# Patient Record
Sex: Male | Born: 1950
Health system: Southern US, Community
[De-identification: ages and names within clinical notes are randomized; demographics above are authoritative.]

## PROBLEM LIST (undated history)

## (undated) DIAGNOSIS — W3400XA Accidental discharge from unspecified firearms or gun, initial encounter: Secondary | ICD-10-CM

## (undated) DIAGNOSIS — I10 Essential (primary) hypertension: Secondary | ICD-10-CM

## (undated) DIAGNOSIS — B1920 Unspecified viral hepatitis C without hepatic coma: Secondary | ICD-10-CM

## (undated) DIAGNOSIS — F209 Schizophrenia, unspecified: Secondary | ICD-10-CM

## (undated) DIAGNOSIS — K651 Peritoneal abscess: Secondary | ICD-10-CM

## (undated) HISTORY — PX: OTHER SURGICAL HISTORY: SHX169

---

## 2000-07-01 ENCOUNTER — Emergency Department (HOSPITAL_COMMUNITY): Admission: EM | Admit: 2000-07-01 | Discharge: 2000-07-01 | Payer: Self-pay | Admitting: *Deleted

## 2011-10-02 ENCOUNTER — Emergency Department (HOSPITAL_COMMUNITY)
Admission: EM | Admit: 2011-10-02 | Discharge: 2011-10-02 | Disposition: A | Discharge status: Home / Self Care | Payer: Self-pay | Attending: Emergency Medicine | Admitting: Emergency Medicine

## 2011-10-02 ENCOUNTER — Emergency Department (HOSPITAL_COMMUNITY): Payer: Self-pay

## 2011-10-02 ENCOUNTER — Encounter (HOSPITAL_COMMUNITY): Payer: Self-pay | Admitting: Emergency Medicine

## 2011-10-02 DIAGNOSIS — M79606 Pain in leg, unspecified: Secondary | ICD-10-CM

## 2011-10-02 DIAGNOSIS — M79609 Pain in unspecified limb: Secondary | ICD-10-CM | POA: Insufficient documentation

## 2011-10-02 DIAGNOSIS — I10 Essential (primary) hypertension: Secondary | ICD-10-CM | POA: Insufficient documentation

## 2011-10-02 DIAGNOSIS — Z79899 Other long term (current) drug therapy: Secondary | ICD-10-CM | POA: Insufficient documentation

## 2011-10-02 DIAGNOSIS — Z87828 Personal history of other (healed) physical injury and trauma: Secondary | ICD-10-CM | POA: Insufficient documentation

## 2011-10-02 HISTORY — DX: Essential (primary) hypertension: I10

## 2011-10-02 HISTORY — DX: Accidental discharge from unspecified firearms or gun, initial encounter: W34.00XA

## 2011-10-02 MED ORDER — OXYCODONE-ACETAMINOPHEN 5-325 MG PO TABS
1.0000 | ORAL_TABLET | Freq: Once | ORAL | Status: AC
  Administered 2011-10-02: 1 via ORAL
  Filled 2011-10-02: qty 1

## 2011-10-02 MED ORDER — HYDROCODONE-ACETAMINOPHEN 5-325 MG PO TABS: 1.0000 | ORAL_TABLET | Freq: Four times a day (QID) | ORAL | Status: AC | PRN

## 2011-10-02 MED ORDER — NAPROXEN 500 MG PO TABS: 500.0000 mg | ORAL_TABLET | Freq: Two times a day (BID) | ORAL | Status: DC

## 2011-10-02 NOTE — ED Notes (Signed)
Patient c/o left leg pain "from the hip down to the ankle" for five days worsening each day. Patient denies history of back injury or leg injury.
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
## 2012-01-13 ENCOUNTER — Encounter (HOSPITAL_COMMUNITY): Payer: Self-pay | Admitting: Emergency Medicine

## 2012-01-13 DIAGNOSIS — I10 Essential (primary) hypertension: Secondary | ICD-10-CM | POA: Insufficient documentation

## 2012-01-13 DIAGNOSIS — Z87828 Personal history of other (healed) physical injury and trauma: Secondary | ICD-10-CM | POA: Insufficient documentation

## 2012-01-13 DIAGNOSIS — R109 Unspecified abdominal pain: Secondary | ICD-10-CM | POA: Insufficient documentation

## 2012-01-13 DIAGNOSIS — Z79899 Other long term (current) drug therapy: Secondary | ICD-10-CM | POA: Insufficient documentation

## 2012-01-13 DIAGNOSIS — R112 Nausea with vomiting, unspecified: Secondary | ICD-10-CM | POA: Insufficient documentation

## 2012-01-13 DIAGNOSIS — R197 Diarrhea, unspecified: Secondary | ICD-10-CM | POA: Insufficient documentation

## 2012-01-13 DIAGNOSIS — R509 Fever, unspecified: Secondary | ICD-10-CM | POA: Insufficient documentation

## 2012-01-13 NOTE — ED Notes (Signed)
Patient complaining of fever, abdominal pain, vomiting, and diarrhea x 3 days.

## 2012-01-14 ENCOUNTER — Emergency Department (HOSPITAL_COMMUNITY)
Admission: EM | Admit: 2012-01-14 | Discharge: 2012-01-14 | Disposition: A | Discharge status: Home / Self Care | Payer: Self-pay | Attending: Emergency Medicine | Admitting: Emergency Medicine

## 2012-01-14 DIAGNOSIS — R112 Nausea with vomiting, unspecified: Secondary | ICD-10-CM

## 2012-01-14 DIAGNOSIS — R509 Fever, unspecified: Secondary | ICD-10-CM

## 2012-01-14 DIAGNOSIS — R109 Unspecified abdominal pain: Secondary | ICD-10-CM

## 2012-01-14 LAB — BASIC METABOLIC PANEL
Chloride: 98 mEq/L (ref 96–112)
GFR calc Af Amer: 63 mL/min — ABNORMAL LOW (ref >90)
Potassium: 3.6 mEq/L (ref 3.5–5.1)
Sodium: 131 mEq/L — ABNORMAL LOW (ref 135–145)

## 2012-01-14 LAB — CBC
HCT: 43.4 % (ref 39.0–52.0)
Hemoglobin: 15.2 g/dL (ref 13.0–17.0)
RBC: 5.08 MIL/uL (ref 4.22–5.81)
WBC: 25.3 10*3/uL — ABNORMAL HIGH (ref 4.0–10.5)

## 2012-01-14 MED ORDER — ONDANSETRON HCL 4 MG/2ML IJ SOLN: 4.0000 mg | Freq: Once | INTRAMUSCULAR | Status: DC

## 2012-01-14 MED ORDER — HYDROCODONE-ACETAMINOPHEN 5-325 MG PO TABS: 1.0000 | ORAL_TABLET | ORAL | Status: DC | PRN

## 2012-01-14 MED ORDER — ONDANSETRON HCL 4 MG/2ML IJ SOLN
4.0000 mg | Freq: Once | INTRAMUSCULAR | Status: AC
Start: 2012-01-14 — End: 2012-01-14
  Administered 2012-01-14: 4 mg via INTRAVENOUS
  Filled 2012-01-14: qty 2

## 2012-01-14 MED ORDER — MORPHINE SULFATE 2 MG/ML IJ SOLN
2.0000 mg | Freq: Once | INTRAMUSCULAR | Status: AC
  Administered 2012-01-14: 2 mg via INTRAVENOUS
  Filled 2012-01-14: qty 1

## 2012-01-14 MED ORDER — ONDANSETRON HCL 4 MG PO TABS: 4.0000 mg | ORAL_TABLET | Freq: Four times a day (QID) | ORAL | Status: DC

## 2012-01-14 MED ORDER — SODIUM CHLORIDE 0.9 % IV BOLUS (SEPSIS)
1000.0000 mL | Freq: Once | INTRAVENOUS | Status: AC
  Administered 2012-01-14: 1000 mL via INTRAVENOUS

## 2012-01-14 NOTE — ED Notes (Signed)
N/v/d on and off x3 days, States that he has not been able to hold down any food and not much fluid. States 2 episodes of emesis tonight PTA. Pt aax4 at this time. Also complains of mid right sided abd pain, tender to touch
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
## 2012-01-15 DIAGNOSIS — K651 Peritoneal abscess: Secondary | ICD-10-CM

## 2012-01-15 HISTORY — DX: Peritoneal abscess: K65.1

## 2012-01-24 ENCOUNTER — Inpatient Hospital Stay (HOSPITAL_COMMUNITY)
Admission: EM | Admit: 2012-01-24 | Discharge: 2012-01-27 | DRG: 373 | Disposition: A | Discharge status: Home / Self Care | Payer: Medicare Other | Attending: General Surgery | Admitting: General Surgery

## 2012-01-24 ENCOUNTER — Emergency Department (HOSPITAL_COMMUNITY): Payer: Medicare Other

## 2012-01-24 ENCOUNTER — Encounter (HOSPITAL_COMMUNITY): Payer: Self-pay

## 2012-01-24 DIAGNOSIS — Z7982 Long term (current) use of aspirin: Secondary | ICD-10-CM

## 2012-01-24 DIAGNOSIS — K651 Peritoneal abscess: Secondary | ICD-10-CM

## 2012-01-24 DIAGNOSIS — I10 Essential (primary) hypertension: Secondary | ICD-10-CM | POA: Diagnosis present

## 2012-01-24 DIAGNOSIS — Z79899 Other long term (current) drug therapy: Secondary | ICD-10-CM

## 2012-01-24 LAB — CBC WITH DIFFERENTIAL/PLATELET
Basophils Relative: 1 % (ref 0–1)
Eosinophils Absolute: 0.2 10*3/uL (ref 0.0–0.7)
Eosinophils Relative: 2 % (ref 0–5)
Hemoglobin: 11.9 g/dL — ABNORMAL LOW (ref 13.0–17.0)
MCH: 29.2 pg (ref 26.0–34.0)
MCHC: 34.4 g/dL (ref 30.0–36.0)
MCV: 84.8 fL (ref 78.0–100.0)
Monocytes Relative: 9 % (ref 3–12)
Neutrophils Relative %: 67 % (ref 43–77)

## 2012-01-24 LAB — URINALYSIS, ROUTINE W REFLEX MICROSCOPIC
Bilirubin Urine: NEGATIVE
Hgb urine dipstick: NEGATIVE
Ketones, ur: NEGATIVE mg/dL
Nitrite: NEGATIVE
Urobilinogen, UA: 2 mg/dL — ABNORMAL HIGH (ref 0.0–1.0)
pH: 6 (ref 5.0–8.0)

## 2012-01-24 LAB — COMPREHENSIVE METABOLIC PANEL
Alkaline Phosphatase: 61 U/L (ref 39–117)
BUN: 10 mg/dL (ref 6–23)
Chloride: 101 mEq/L (ref 96–112)
GFR calc Af Amer: >90 mL/min (ref >90)
Glucose, Bld: 101 mg/dL — ABNORMAL HIGH (ref 70–99)
Potassium: 3.7 mEq/L (ref 3.5–5.1)
Total Bilirubin: 0.5 mg/dL (ref 0.3–1.2)

## 2012-01-24 LAB — LIPASE, BLOOD: Lipase: 39 U/L (ref 11–59)

## 2012-01-24 MED ORDER — HYDROMORPHONE HCL PF 1 MG/ML IJ SOLN
1.0000 mg | INTRAMUSCULAR | Status: DC | PRN
  Administered 2012-01-24 – 2012-01-27 (×7): 1 mg via INTRAVENOUS
  Filled 2012-01-24 (×7): qty 1

## 2012-01-24 MED ORDER — ONDANSETRON HCL 4 MG/2ML IJ SOLN: 4.0000 mg | Freq: Four times a day (QID) | INTRAMUSCULAR | Status: DC | PRN

## 2012-01-24 MED ORDER — SODIUM CHLORIDE 0.9 % IV SOLN
1000.0000 mL | INTRAVENOUS | Status: DC
  Administered 2012-01-24 – 2012-01-26 (×2): 1000 mL via INTRAVENOUS

## 2012-01-24 MED ORDER — LACTATED RINGERS IV SOLN
INTRAVENOUS | Status: DC
  Administered 2012-01-24 – 2012-01-25 (×3): via INTRAVENOUS

## 2012-01-24 MED ORDER — ENOXAPARIN SODIUM 40 MG/0.4ML ~~LOC~~ SOLN
40.0000 mg | SUBCUTANEOUS | Status: DC
  Administered 2012-01-25 – 2012-01-27 (×4): 40 mg via SUBCUTANEOUS
  Filled 2012-01-24 (×5): qty 0.4

## 2012-01-24 MED ORDER — IOHEXOL 300 MG/ML  SOLN
100.0000 mL | Freq: Once | INTRAMUSCULAR | Status: AC | PRN
  Administered 2012-01-24: 100 mL via INTRAVENOUS

## 2012-01-24 MED ORDER — SODIUM CHLORIDE 0.9 % IV SOLN
Freq: Once | INTRAVENOUS | Status: AC
Start: 2012-01-24 — End: 2012-01-24
  Administered 2012-01-24: 20:00:00 via INTRAVENOUS

## 2012-01-24 MED ORDER — PANTOPRAZOLE SODIUM 40 MG IV SOLR
40.0000 mg | Freq: Every day | INTRAVENOUS | Status: DC
  Administered 2012-01-24 – 2012-01-26 (×3): 40 mg via INTRAVENOUS
  Filled 2012-01-24 (×3): qty 40
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
## 2012-01-24 NOTE — ED Notes (Signed)
Pt reports severe abd pain for the past few days.  Pt denies any n/v/d.

## 2012-01-25 ENCOUNTER — Encounter (HOSPITAL_COMMUNITY): Payer: Self-pay | Admitting: *Deleted

## 2012-01-25 LAB — BASIC METABOLIC PANEL
BUN: 10 mg/dL (ref 6–23)
CO2: 22 mEq/L (ref 19–32)
Calcium: 8.4 mg/dL (ref 8.4–10.5)
GFR calc non Af Amer: 88 mL/min — ABNORMAL LOW (ref >90)
Glucose, Bld: 100 mg/dL — ABNORMAL HIGH (ref 70–99)
Potassium: 3.6 mEq/L (ref 3.5–5.1)

## 2012-01-25 LAB — CBC
Hemoglobin: 11 g/dL — ABNORMAL LOW (ref 13.0–17.0)
MCH: 29.1 pg (ref 26.0–34.0)
MCHC: 34.2 g/dL (ref 30.0–36.0)
MCV: 85.2 fL (ref 78.0–100.0)
RBC: 3.78 MIL/uL — ABNORMAL LOW (ref 4.22–5.81)

## 2012-01-25 MED ORDER — SODIUM CHLORIDE 0.9 % IV SOLN
1.0000 g | INTRAVENOUS | Status: DC
  Administered 2012-01-25 – 2012-01-27 (×3): 1 g via INTRAVENOUS
  Filled 2012-01-25 (×4): qty 1

## 2012-01-25 MED ORDER — BENZTROPINE MESYLATE 1 MG PO TABS
4.0000 mg | ORAL_TABLET | Freq: Every day | ORAL | Status: DC
  Administered 2012-01-25 – 2012-01-26 (×2): 4 mg via ORAL
  Filled 2012-01-25 (×2): qty 4

## 2012-01-25 MED ORDER — THIOTHIXENE 5 MG PO CAPS
15.0000 mg | ORAL_CAPSULE | Freq: Every day | ORAL | Status: DC
  Administered 2012-01-25 – 2012-01-26 (×2): 15 mg via ORAL
  Filled 2012-01-25 (×2): qty 3
  Filled 2012-01-25 (×2): qty 1

## 2012-01-25 NOTE — Progress Notes (Signed)
Pt. abmulated in hall, with no assistance. Pt. Tolerated well a distance of 661ft. No SOB, no significant increase in pain.
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
## 2012-01-26 ENCOUNTER — Encounter (HOSPITAL_COMMUNITY): Payer: Self-pay

## 2012-01-26 LAB — CBC
HCT: 32.7 % — ABNORMAL LOW (ref 39.0–52.0)
MCHC: 34.6 g/dL (ref 30.0–36.0)
MCV: 84.5 fL (ref 78.0–100.0)
Platelets: 673 10*3/uL — ABNORMAL HIGH (ref 150–400)
RDW: 13.2 % (ref 11.5–15.5)
WBC: 9.4 10*3/uL (ref 4.0–10.5)

## 2012-01-26 LAB — BASIC METABOLIC PANEL
BUN: 11 mg/dL (ref 6–23)
Calcium: 8.8 mg/dL (ref 8.4–10.5)
Chloride: 99 mEq/L (ref 96–112)
Creatinine, Ser: 1.02 mg/dL (ref 0.50–1.35)
GFR calc Af Amer: 89 mL/min — ABNORMAL LOW (ref >90)
GFR calc non Af Amer: 77 mL/min — ABNORMAL LOW (ref >90)

## 2012-01-26 MED ORDER — SODIUM CHLORIDE 0.9 % IJ SOLN: 3.0000 mL | INTRAMUSCULAR | Status: DC | PRN

## 2012-01-26 MED ORDER — SODIUM CHLORIDE 0.9 % IV SOLN: 250.0000 mL | INTRAVENOUS | Status: DC | PRN

## 2012-01-26 MED ORDER — SODIUM CHLORIDE 0.9 % IJ SOLN
3.0000 mL | Freq: Two times a day (BID) | INTRAMUSCULAR | Status: DC
  Administered 2012-01-26 – 2012-01-27 (×3): 3 mL via INTRAVENOUS

## 2012-01-26 NOTE — Progress Notes (Signed)
  Subjective: Pain much better.  No fever or chills.  No nausea.    Objective: Vital signs in last 24 hours: Temp:  [97.5 F (36.4 C)-99.3 F (37.4 C)] 99.3 F (37.4 C) (01/12 2043) Pulse Rate:  [62-80] 73  (01/12 2043) Resp:  [18] 18  (01/12 2043) BP: (111-118)/(70-77) 114/77 mmHg (01/12 2043) SpO2:  [97 %-100 %] 97 % (01/12 2043) Last BM Date: 01/26/12  Intake/Output from previous day:   Intake/Output this shift:    General appearance: alert and no distress GI: +BS, soft, mild tenderness.  NO peritoneal signs.  No masses.    Lab Results:   [facility] 01/26/12 0645 01/25/12 0556  WBC 9.4 11.9*  HGB 11.3* 11.0*  HCT 32.7* 32.2*  PLT 673* 664*   BMET  Basename 01/26/12 0645 01/25/12 0556  NA 131* 132*  K 3.7 3.6  CL 99 100  CO2 24 22  GLUCOSE 108* 100*  BUN 11 10  CREATININE 1.02 0.93  CALCIUM 8.8 8.4   PT/INR No results found for this basename: LABPROT:2,INR:2 in the last 72 hours ABG No results found for this basename: PHART:2,PCO2:2,PO2:2,HCO3:2 in the last 72 hours  Studies/Results: No results found.  Anti-infectives: Anti-infectives     Start     Dose/Rate Route Frequency Ordered Stop   01/25/12 0030   ertapenem (INVANZ) 1 g in sodium chloride 0.9 % 50 mL IVPB        1 g 100 mL/hr over 30 Minutes Intravenous Every 24 hours 01/25/12 0025            Assessment/Plan: s/p * No surgery found * Intra-abdominal abscess.  Right sided.  Responding appropriately to abx and bowel rest.Will start clears.  Possible transition to oral abx medications tomorrow.    LOS: 2 days    Alik Mawson C 01/26/2012

## 2012-01-27 LAB — BASIC METABOLIC PANEL
BUN: 10 mg/dL (ref 6–23)
CO2: 26 mEq/L (ref 19–32)
Calcium: 9.3 mg/dL (ref 8.4–10.5)
Chloride: 100 mEq/L (ref 96–112)
Creatinine, Ser: 1.1 mg/dL (ref 0.50–1.35)
Glucose, Bld: 98 mg/dL (ref 70–99)

## 2012-01-27 LAB — CBC
HCT: 33.7 % — ABNORMAL LOW (ref 39.0–52.0)
Hemoglobin: 11.5 g/dL — ABNORMAL LOW (ref 13.0–17.0)
MCH: 28.9 pg (ref 26.0–34.0)
MCV: 84.7 fL (ref 78.0–100.0)
Platelets: 663 10*3/uL — ABNORMAL HIGH (ref 150–400)
RBC: 3.98 MIL/uL — ABNORMAL LOW (ref 4.22–5.81)
WBC: 8.1 10*3/uL (ref 4.0–10.5)

## 2012-01-27 MED ORDER — AMOXICILLIN-POT CLAVULANATE 875-125 MG PO TABS
1.0000 | ORAL_TABLET | Freq: Two times a day (BID) | ORAL | Status: DC
  Administered 2012-01-27: 1 via ORAL
  Filled 2012-01-27: qty 1

## 2012-01-27 MED ORDER — AMOXICILLIN-POT CLAVULANATE 875-125 MG PO TABS: 1.0000 | ORAL_TABLET | Freq: Two times a day (BID) | ORAL | Status: DC

## 2012-01-27 MED ORDER — HYDROCODONE-ACETAMINOPHEN 5-325 MG PO TABS: 1.0000 | ORAL_TABLET | ORAL | Status: DC | PRN

## 2012-01-27 NOTE — Care Management Note (Signed)
    Page 1 of 1   01/27/2012     2:35:38 PM   CARE MANAGEMENT NOTE 01/27/2012  Patient:  Riley Dixon, Riley Dixon   Account Number:  [id number]  Date Initiated:  01/27/2012  Documentation initiated by:  Sharrie Rothman  Subjective/Objective Assessment:   Pt admitted from home with abd pain. Pt lives alone and will return home at discharge. Pt is independent with ADL's.     Action/Plan:   Pt is discharged home today. No CM or HH needs noted.   Anticipated DC Date:  01/27/2012   Anticipated DC Plan:  HOME/SELF CARE      DC Planning Services  CM consult      Choice offered to / List presented to:             Status of service:  Completed, signed off Medicare Important Message given?  YES (If response is "NO", the following Medicare IM given date fields will be blank) Date Medicare IM given:  01/27/2012 Date Additional Medicare IM given:    Discharge Disposition:  HOME/SELF CARE  Per UR Regulation:    If discussed at Long Length of Stay Meetings, dates discussed:    Comments:  01/27/12 1435 Arlyss Queen, RN BSN CM

## 2012-01-27 NOTE — Progress Notes (Signed)
  Subjective: Pain is resolved. No nausea. Tolerating regular diet. No fevers or chills.  Objective: Vital signs in last 24 hours: Temp:  [98 F (36.7 C)-99.3 F (37.4 C)] 98.7 F (37.1 C) (01/13 0506) Pulse Rate:  [67-80] 67  (01/13 0506) Resp:  [18] 18  (01/13 0506) BP: (97-118)/(64-77) 97/64 mmHg (01/13 0506) SpO2:  [97 %-100 %] 100 % (01/13 0506) Last BM Date: 01/26/12  Intake/Output from previous day: 01/12 0701 - 01/13 0700 In: 876 [P.O.:720; I.V.:6; IV Piggyback:150] Out: -  Intake/Output this shift: Total I/O In: 240 [P.O.:240] Out: -   General appearance: alert and no distress GI: Abdomen soft, positive bowel sounds, no tenderness. No masses.  Lab Results:   [facility] 01/27/12 0605 01/26/12 0645  WBC 8.1 9.4  HGB 11.5* 11.3*  HCT 33.7* 32.7*  PLT 663* 673*   BMET  Basename 01/27/12 0605 01/26/12 0645  NA 134* 131*  K 4.2 3.7  CL 100 99  CO2 26 24  GLUCOSE 98 108*  BUN 10 11  CREATININE 1.10 1.02  CALCIUM 9.3 8.8   PT/INR No results found for this basename: LABPROT:2,INR:2 in the last 72 hours ABG No results found for this basename: PHART:2,PCO2:2,PO2:2,HCO3:2 in the last 72 hours  Studies/Results: No results found.  Anti-infectives: Anti-infectives     Start     Dose/Rate Route Frequency Ordered Stop   01/27/12 1215   amoxicillin-clavulanate (AUGMENTIN) 875-125 MG per tablet 1 tablet        1 tablet Oral Every 12 hours 01/27/12 1202     01/25/12 0030   ertapenem (INVANZ) 1 g in sodium chloride 0.9 % 50 mL IVPB  Status:  Discontinued        1 g 100 mL/hr over 30 Minutes Intravenous Every 24 hours 01/25/12 0025 01/27/12 1202          Assessment/Plan: s/p * No surgery found * Intra-abdominal abscess. Continue AmBisome. We'll transition patient to oral antibiotics. He tolerates these I am comfortable with patient being discharged with close followup next week in the office. I've also discussed with the patient repeat of the CT scan of the  abdomen and pelvis to reevaluate progression with the intradermal fluid collection. Patient expresses understanding. Patient understands reasons to return to the hospital or to notify her options. Again should patient tolerate oral antibiotics will plan to discharge later this afternoon.  LOS: 3 days    Kashif Pooler C 01/27/2012

## 2012-01-27 NOTE — Progress Notes (Signed)
UR chart review completed.  

## 2012-01-27 NOTE — Progress Notes (Signed)
Discharge instructions given, verbalized understanding, out in stable condition ambulatory with family. 

## 2012-02-19 NOTE — Discharge Summary (Signed)
Physician Discharge Summary  Patient ID: Riley Dixon MRN: [id number] DOB/AGE: [birth year] 63 y.o.  Admit date: 01/24/2012 Discharge date: 01/27/2012  Admission Diagnoses:Intra-abdominal abscess  Discharge Diagnoses: The same Active Problems:  * No active hospital problems. *    Discharged Condition: stable  Hospital Course: Patient presented with Right sided abdominal pain.  A localized phlegmon with suspected associated abscess was noted.  Patient admitted for continue management.  IV abx started.  Patient tolerated conservative measures.  Started on oral abx and plans were made for discharge.    Consults: None  Significant Diagnostic Studies: radiology: CT scan: abd/pel  Treatments: IV hydration and antibiotics: Invanz  Discharge Exam: Blood pressure 121/75, pulse 73, temperature 98 F (36.7 C), temperature source Oral, resp. rate 18, height 5\' 8"  (1.727 m), weight 85.9 kg (189 lb 6 oz), SpO2 100.00%. General appearance: alert and no distress Resp: clear to auscultation bilaterally Cardio: regular rate and rhythm GI: soft, +BS, mild tenderness.  No peritoneal signs.  Disposition: 01-Home or Self Care  Discharge Orders    Future Orders Please Complete By Expires   Diet - low sodium heart healthy      Increase activity slowly      Discharge instructions      Comments:   Increase activity as tolerated. May place ice pack for comfort.  Alternate an anti-inflammatory such as ibuprofen (Motrin, Advil) 400-600mg  every 6 hours with the prescribed pain medication.   Do not take any additional acetaminophen as there is Tylenol in the pain medication.   Driving Restrictions      Comments:   No driving while on pain medications.   Call MD for:  temperature >100.4      Call MD for:  persistant nausea and vomiting      Call MD for:  severe uncontrolled pain      Call MD for:  redness, tenderness, or signs of infection (pain, swelling, redness, odor or green/yellow discharge around  incision site)          Medication List     As of 02/19/2012 10:12 PM    TAKE these medications         acetaminophen 500 MG tablet   Commonly known as: TYLENOL   Take 1,000 mg by mouth every 6 (six) hours as needed. Pain      amoxicillin-clavulanate 875-125 MG per tablet   Commonly known as: AUGMENTIN   Take 1 tablet by mouth every 12 (twelve) hours.      aspirin EC 81 MG tablet   Take 81 mg by mouth daily.      benztropine 2 MG tablet   Commonly known as: COGENTIN   Take 4 mg by mouth at bedtime.      HYDROcodone-acetaminophen 5-325 MG per tablet   Commonly known as: NORCO/VICODIN   Take 1-2 tablets by mouth every 4 (four) hours as needed for pain.      thiothixene 5 MG capsule   Commonly known as: NAVANE   Take 15 mg by mouth at bedtime.           Follow-up Information    Follow up with Fabio Bering, MD. On 02/04/2012. (9:30 am)    Contact information:   [address] [place] [address] [phone]          Signed: Tilford Pillar C 02/19/2012, 10:12 PM

## 2013-04-27 ENCOUNTER — Encounter (HOSPITAL_COMMUNITY): Payer: Self-pay | Admitting: Emergency Medicine

## 2013-04-27 ENCOUNTER — Emergency Department (HOSPITAL_COMMUNITY)
Admission: EM | Admit: 2013-04-27 | Discharge: 2013-04-27 | Disposition: A | Discharge status: Home / Self Care | Payer: No Typology Code available for payment source | Attending: Emergency Medicine | Admitting: Emergency Medicine

## 2013-04-27 ENCOUNTER — Emergency Department (HOSPITAL_COMMUNITY): Payer: No Typology Code available for payment source

## 2013-04-27 DIAGNOSIS — Z7982 Long term (current) use of aspirin: Secondary | ICD-10-CM | POA: Insufficient documentation

## 2013-04-27 DIAGNOSIS — Z8639 Personal history of other endocrine, nutritional and metabolic disease: Secondary | ICD-10-CM | POA: Insufficient documentation

## 2013-04-27 DIAGNOSIS — S7000XA Contusion of unspecified hip, initial encounter: Secondary | ICD-10-CM | POA: Insufficient documentation

## 2013-04-27 DIAGNOSIS — F209 Schizophrenia, unspecified: Secondary | ICD-10-CM | POA: Insufficient documentation

## 2013-04-27 DIAGNOSIS — Z8619 Personal history of other infectious and parasitic diseases: Secondary | ICD-10-CM | POA: Insufficient documentation

## 2013-04-27 DIAGNOSIS — Z862 Personal history of diseases of the blood and blood-forming organs and certain disorders involving the immune mechanism: Secondary | ICD-10-CM | POA: Insufficient documentation

## 2013-04-27 DIAGNOSIS — Y9389 Activity, other specified: Secondary | ICD-10-CM | POA: Insufficient documentation

## 2013-04-27 DIAGNOSIS — Z87828 Personal history of other (healed) physical injury and trauma: Secondary | ICD-10-CM | POA: Insufficient documentation

## 2013-04-27 DIAGNOSIS — I10 Essential (primary) hypertension: Secondary | ICD-10-CM | POA: Insufficient documentation

## 2013-04-27 DIAGNOSIS — Z79899 Other long term (current) drug therapy: Secondary | ICD-10-CM | POA: Insufficient documentation

## 2013-04-27 DIAGNOSIS — Y92410 Unspecified street and highway as the place of occurrence of the external cause: Secondary | ICD-10-CM | POA: Insufficient documentation

## 2013-04-27 HISTORY — DX: Unspecified viral hepatitis C without hepatic coma: B19.20

## 2013-04-27 HISTORY — DX: Schizophrenia, unspecified: F20.9

## 2013-04-27 MED ORDER — HYDROCODONE-ACETAMINOPHEN 5-325 MG PO TABS
2.0000 | ORAL_TABLET | Freq: Once | ORAL | Status: AC
  Administered 2013-04-27: 2 via ORAL
  Filled 2013-04-27: qty 2

## 2013-04-27 MED ORDER — HYDROCODONE-ACETAMINOPHEN 5-325 MG PO TABS: 1.0000 | ORAL_TABLET | Freq: Four times a day (QID) | ORAL | Status: DC | PRN

## 2013-04-27 NOTE — Discharge Instructions (Signed)
Always wear a helmet when riding a bicycle. Take Tylenol as directed for mild pain or the pain medicine prescribed for bad pain. See the [facility] or go to an urgent care Center if significant pain in 3 or 4 days.  Contusion A contusion is a deep bruise. Contusions are the result of an injury that caused bleeding under the skin. The contusion may turn blue, purple, or yellow. Minor injuries will give you a painless contusion, but more severe contusions may stay painful and swollen for a few weeks.  CAUSES  A contusion is usually caused by a blow, trauma, or direct force to an area of the body. SYMPTOMS   Swelling and redness of the injured area.  Bruising of the injured area.  Tenderness and soreness of the injured area.  Pain. DIAGNOSIS  The diagnosis can be made by taking a history and physical exam. An X-ray, CT scan, or MRI may be needed to determine if there were any associated injuries, such as fractures. TREATMENT  Specific treatment will depend on what area of the body was injured. In general, the best treatment for a contusion is resting, icing, elevating, and applying cold compresses to the injured area. Over-the-counter medicines may also be recommended for pain control. Ask your caregiver what the best treatment is for your contusion. HOME CARE INSTRUCTIONS   Put ice on the injured area.  Put ice in a plastic bag.  Place a towel between your skin and the bag.  Leave the ice on for 15-20 minutes, 03-04 times a day.  Only take over-the-counter or prescription medicines for pain, discomfort, or fever as directed by your caregiver. Your caregiver may recommend avoiding anti-inflammatory medicines (aspirin, ibuprofen, and naproxen) for 48 hours because these medicines may increase bruising.  Rest the injured area.  If possible, elevate the injured area to reduce swelling. SEEK IMMEDIATE MEDICAL CARE IF:   You have increased bruising or swelling.  You have pain that is  getting worse.  Your swelling or pain is not relieved with medicines. MAKE SURE YOU:   Understand these instructions.  Will watch your condition.  Will get help right away if you are not doing well or get worse. Document Released: 10/10/2004 Document Revised: 03/25/2011 Document Reviewed: 11/05/2010 [facility] Patient Information 2014 [place], [place].
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
## 2014-01-14 HISTORY — PX: REPLACEMENT TOTAL KNEE: SUR1224

## 2015-03-07 DIAGNOSIS — F251 Schizoaffective disorder, depressive type: Secondary | ICD-10-CM | POA: Diagnosis not present

## 2015-03-15 DIAGNOSIS — I10 Essential (primary) hypertension: Secondary | ICD-10-CM | POA: Diagnosis not present

## 2015-03-15 DIAGNOSIS — J22 Unspecified acute lower respiratory infection: Secondary | ICD-10-CM | POA: Diagnosis not present

## 2015-06-27 DIAGNOSIS — F251 Schizoaffective disorder, depressive type: Secondary | ICD-10-CM | POA: Diagnosis not present

## 2015-10-17 DIAGNOSIS — F251 Schizoaffective disorder, depressive type: Secondary | ICD-10-CM | POA: Diagnosis not present

## 2015-12-09 ENCOUNTER — Emergency Department (HOSPITAL_COMMUNITY): Payer: PPO

## 2015-12-09 ENCOUNTER — Observation Stay (HOSPITAL_COMMUNITY)
Admission: EM | Admit: 2015-12-09 | Discharge: 2015-12-10 | Disposition: A | Discharge status: Home / Self Care | Payer: PPO | Attending: Internal Medicine | Admitting: Internal Medicine

## 2015-12-09 ENCOUNTER — Encounter (HOSPITAL_COMMUNITY): Payer: Self-pay | Admitting: Emergency Medicine

## 2015-12-09 DIAGNOSIS — C50929 Malignant neoplasm of unspecified site of unspecified male breast: Secondary | ICD-10-CM | POA: Diagnosis present

## 2015-12-09 DIAGNOSIS — I10 Essential (primary) hypertension: Secondary | ICD-10-CM | POA: Insufficient documentation

## 2015-12-09 DIAGNOSIS — F209 Schizophrenia, unspecified: Secondary | ICD-10-CM | POA: Diagnosis present

## 2015-12-09 DIAGNOSIS — Z7982 Long term (current) use of aspirin: Secondary | ICD-10-CM | POA: Diagnosis not present

## 2015-12-09 DIAGNOSIS — N281 Cyst of kidney, acquired: Secondary | ICD-10-CM | POA: Diagnosis not present

## 2015-12-09 DIAGNOSIS — R197 Diarrhea, unspecified: Secondary | ICD-10-CM

## 2015-12-09 DIAGNOSIS — K529 Noninfective gastroenteritis and colitis, unspecified: Secondary | ICD-10-CM | POA: Diagnosis not present

## 2015-12-09 DIAGNOSIS — Z23 Encounter for immunization: Secondary | ICD-10-CM | POA: Insufficient documentation

## 2015-12-09 DIAGNOSIS — Z79899 Other long term (current) drug therapy: Secondary | ICD-10-CM | POA: Diagnosis not present

## 2015-12-09 HISTORY — DX: Peritoneal abscess: K65.1

## 2015-12-09 LAB — COMPREHENSIVE METABOLIC PANEL
ALBUMIN: 3.3 g/dL — AB (ref 3.5–5.0)
ALT: 17 U/L (ref 17–63)
AST: 16 U/L (ref 15–41)
Alkaline Phosphatase: 38 U/L (ref 38–126)
Anion gap: 6 (ref 5–15)
BUN: 9 mg/dL (ref 6–20)
CHLORIDE: 107 mmol/L (ref 101–111)
CO2: 23 mmol/L (ref 22–32)
Calcium: 8.6 mg/dL — ABNORMAL LOW (ref 8.9–10.3)
Creatinine, Ser: 1.11 mg/dL (ref 0.61–1.24)
GFR calc Af Amer: >60 mL/min (ref >60)
GLUCOSE: 106 mg/dL — AB (ref 65–99)
POTASSIUM: 3.5 mmol/L (ref 3.5–5.1)
SODIUM: 136 mmol/L (ref 135–145)
Total Bilirubin: 0.6 mg/dL (ref 0.3–1.2)
Total Protein: 6.5 g/dL (ref 6.5–8.1)

## 2015-12-09 LAB — URINALYSIS, ROUTINE W REFLEX MICROSCOPIC
Bilirubin Urine: NEGATIVE
GLUCOSE, UA: NEGATIVE mg/dL
HGB URINE DIPSTICK: NEGATIVE
KETONES UR: NEGATIVE mg/dL
LEUKOCYTES UA: NEGATIVE
Nitrite: NEGATIVE
PH: 6 (ref 5.0–8.0)
Protein, ur: NEGATIVE mg/dL
Specific Gravity, Urine: <1.005 — ABNORMAL LOW (ref 1.005–1.030)

## 2015-12-09 LAB — CBC
HEMATOCRIT: 35.3 % — AB (ref 39.0–52.0)
Hemoglobin: 12 g/dL — ABNORMAL LOW (ref 13.0–17.0)
MCH: 29.1 pg (ref 26.0–34.0)
MCHC: 34 g/dL (ref 30.0–36.0)
MCV: 85.5 fL (ref 78.0–100.0)
Platelets: 307 10*3/uL (ref 150–400)
RBC: 4.13 MIL/uL — ABNORMAL LOW (ref 4.22–5.81)
RDW: 13.1 % (ref 11.5–15.5)
WBC: 6.8 10*3/uL (ref 4.0–10.5)

## 2015-12-09 LAB — LIPASE, BLOOD: LIPASE: 22 U/L (ref 11–51)

## 2015-12-09 MED ORDER — CIPROFLOXACIN IN D5W 400 MG/200ML IV SOLN
400.0000 mg | Freq: Once | INTRAVENOUS | Status: AC
  Administered 2015-12-09: 400 mg via INTRAVENOUS
  Filled 2015-12-09: qty 200

## 2015-12-09 MED ORDER — ONDANSETRON HCL 4 MG/2ML IJ SOLN: 4.0000 mg | Freq: Four times a day (QID) | INTRAMUSCULAR | Status: DC | PRN

## 2015-12-09 MED ORDER — ASPIRIN EC 81 MG PO TBEC
81.0000 mg | DELAYED_RELEASE_TABLET | Freq: Every evening | ORAL | Status: DC
  Administered 2015-12-09: 81 mg via ORAL
  Filled 2015-12-09 (×2): qty 1

## 2015-12-09 MED ORDER — ENOXAPARIN SODIUM 40 MG/0.4ML ~~LOC~~ SOLN
40.0000 mg | SUBCUTANEOUS | Status: DC
  Administered 2015-12-09: 40 mg via SUBCUTANEOUS
  Filled 2015-12-09: qty 0.4

## 2015-12-09 MED ORDER — ONDANSETRON HCL 4 MG PO TABS: 4.0000 mg | ORAL_TABLET | Freq: Four times a day (QID) | ORAL | Status: DC | PRN

## 2015-12-09 MED ORDER — BENZTROPINE MESYLATE 1 MG PO TABS
4.0000 mg | ORAL_TABLET | Freq: Every day | ORAL | Status: DC
  Administered 2015-12-09: 4 mg via ORAL
  Filled 2015-12-09: qty 4

## 2015-12-09 MED ORDER — THIOTHIXENE 5 MG PO CAPS
15.0000 mg | ORAL_CAPSULE | Freq: Every day | ORAL | Status: DC
  Administered 2015-12-09: 15 mg via ORAL
  Filled 2015-12-09 (×2): qty 1

## 2015-12-09 MED ORDER — ACETAMINOPHEN 325 MG PO TABS: 650.0000 mg | ORAL_TABLET | Freq: Four times a day (QID) | ORAL | Status: DC | PRN

## 2015-12-09 MED ORDER — METRONIDAZOLE 500 MG PO TABS
500.0000 mg | ORAL_TABLET | Freq: Three times a day (TID) | ORAL | Status: DC
  Administered 2015-12-10 (×2): 500 mg via ORAL
  Filled 2015-12-09 (×2): qty 1

## 2015-12-09 MED ORDER — IOPAMIDOL (ISOVUE-300) INJECTION 61%
100.0000 mL | Freq: Once | INTRAVENOUS | Status: AC | PRN
  Administered 2015-12-09: 100 mL via INTRAVENOUS

## 2015-12-09 MED ORDER — METRONIDAZOLE IN NACL 5-0.79 MG/ML-% IV SOLN
500.0000 mg | Freq: Once | INTRAVENOUS | Status: AC
  Administered 2015-12-09: 500 mg via INTRAVENOUS
  Filled 2015-12-09: qty 100

## 2015-12-09 MED ORDER — CIPROFLOXACIN HCL 250 MG PO TABS
500.0000 mg | ORAL_TABLET | Freq: Two times a day (BID) | ORAL | Status: DC
  Administered 2015-12-10: 500 mg via ORAL
  Filled 2015-12-09: qty 2

## 2015-12-09 MED ORDER — SODIUM CHLORIDE 0.9 % IV SOLN
INTRAVENOUS | Status: AC
  Administered 2015-12-09: 19:00:00 via INTRAVENOUS

## 2015-12-09 MED ORDER — ACETAMINOPHEN 650 MG RE SUPP: 650.0000 mg | Freq: Four times a day (QID) | RECTAL | Status: DC | PRN

## 2015-12-09 MED ORDER — IOPAMIDOL (ISOVUE-300) INJECTION 61%
INTRAVENOUS | Status: AC
  Administered 2015-12-09: 30 mL via ORAL
  Filled 2015-12-09: qty 30

## 2015-12-09 MED ORDER — METRONIDAZOLE IN NACL 5-0.79 MG/ML-% IV SOLN
500.0000 mg | Freq: Once | INTRAVENOUS | Status: DC
  Administered 2015-12-09: 500 mg via INTRAVENOUS
  Filled 2015-12-09: qty 100

## 2015-12-09 MED ORDER — PNEUMOCOCCAL VAC POLYVALENT 25 MCG/0.5ML IJ INJ
0.5000 mL | INJECTION | INTRAMUSCULAR | Status: AC
  Administered 2015-12-10: 0.5 mL via INTRAMUSCULAR
  Filled 2015-12-09: qty 0.5

## 2015-12-09 MED ORDER — LOPERAMIDE HCL 2 MG PO CAPS: 2.0000 mg | ORAL_CAPSULE | ORAL | Status: DC | PRN
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
## 2015-12-09 NOTE — ED Notes (Signed)
EDP at bedside  

## 2015-12-09 NOTE — ED Triage Notes (Signed)
Pt reports diarrhea since last Saturday, denies abd pain/n/v.  Pt alert and oriented at this time, ambulatory.  Denies rectal bleeding.

## 2015-12-10 ENCOUNTER — Encounter (HOSPITAL_COMMUNITY): Payer: Self-pay

## 2015-12-10 DIAGNOSIS — K529 Noninfective gastroenteritis and colitis, unspecified: Secondary | ICD-10-CM | POA: Diagnosis not present

## 2015-12-10 DIAGNOSIS — A09 Infectious gastroenteritis and colitis, unspecified: Secondary | ICD-10-CM | POA: Diagnosis not present

## 2015-12-10 LAB — CBC
HEMATOCRIT: 32.5 % — AB (ref 39.0–52.0)
HEMOGLOBIN: 11 g/dL — AB (ref 13.0–17.0)
MCH: 28.9 pg (ref 26.0–34.0)
MCHC: 33.8 g/dL (ref 30.0–36.0)
MCV: 85.3 fL (ref 78.0–100.0)
Platelets: 312 10*3/uL (ref 150–400)
RBC: 3.81 MIL/uL — ABNORMAL LOW (ref 4.22–5.81)
RDW: 13.2 % (ref 11.5–15.5)
WBC: 6.4 10*3/uL (ref 4.0–10.5)

## 2015-12-10 MED ORDER — THIOTHIXENE 5 MG PO CAPS
15.0000 mg | ORAL_CAPSULE | Freq: Every day | ORAL | Status: DC
  Filled 2015-12-10: qty 3

## 2015-12-10 MED ORDER — CIPROFLOXACIN HCL 500 MG PO TABS: 500.0000 mg | ORAL_TABLET | Freq: Two times a day (BID) | ORAL | 0 refills | Status: DC

## 2015-12-10 MED ORDER — METRONIDAZOLE 500 MG PO TABS: 500.0000 mg | ORAL_TABLET | Freq: Three times a day (TID) | ORAL | 0 refills | Status: DC

## 2015-12-10 NOTE — Progress Notes (Signed)
Patient discharged home with IV removed and site intact. Patient discharged home with prescriptions and all personal belongings.
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
## 2015-12-11 LAB — GASTROINTESTINAL PANEL BY PCR, STOOL (REPLACES STOOL CULTURE)
ASTROVIRUS: NOT DETECTED
Adenovirus F40/41: NOT DETECTED
Campylobacter species: NOT DETECTED
Cryptosporidium: NOT DETECTED
Cyclospora cayetanensis: NOT DETECTED
ENTAMOEBA HISTOLYTICA: NOT DETECTED
ENTEROAGGREGATIVE E COLI (EAEC): NOT DETECTED
ENTEROTOXIGENIC E COLI (ETEC): NOT DETECTED
Enteropathogenic E coli (EPEC): NOT DETECTED
Giardia lamblia: NOT DETECTED
NOROVIRUS GI/GII: NOT DETECTED
Plesimonas shigelloides: NOT DETECTED
ROTAVIRUS A: NOT DETECTED
SAPOVIRUS (I, II, IV, AND V): NOT DETECTED
SHIGA LIKE TOXIN PRODUCING E COLI (STEC): NOT DETECTED
SHIGELLA/ENTEROINVASIVE E COLI (EIEC): NOT DETECTED
Salmonella species: NOT DETECTED
VIBRIO CHOLERAE: NOT DETECTED
Vibrio species: NOT DETECTED
Yersinia enterocolitica: NOT DETECTED

## 2016-02-05 DIAGNOSIS — F251 Schizoaffective disorder, depressive type: Secondary | ICD-10-CM | POA: Diagnosis not present

## 2016-05-30 DIAGNOSIS — F251 Schizoaffective disorder, depressive type: Secondary | ICD-10-CM | POA: Diagnosis not present

## 2016-09-11 DIAGNOSIS — F251 Schizoaffective disorder, depressive type: Secondary | ICD-10-CM | POA: Diagnosis not present

## 2016-12-31 DIAGNOSIS — F251 Schizoaffective disorder, depressive type: Secondary | ICD-10-CM | POA: Diagnosis not present

## 2017-02-18 DIAGNOSIS — R69 Illness, unspecified: Secondary | ICD-10-CM | POA: Diagnosis not present

## 2018-01-03 ENCOUNTER — Emergency Department (HOSPITAL_COMMUNITY)
Admission: EM | Admit: 2018-01-03 | Discharge: 2018-01-03 | Disposition: A | Discharge status: Home / Self Care | Payer: Medicare HMO | Attending: Emergency Medicine | Admitting: Emergency Medicine

## 2018-01-03 ENCOUNTER — Other Ambulatory Visit: Payer: Self-pay

## 2018-01-03 ENCOUNTER — Emergency Department (HOSPITAL_COMMUNITY): Payer: Medicare HMO

## 2018-01-03 ENCOUNTER — Encounter (HOSPITAL_COMMUNITY): Payer: Self-pay | Admitting: Emergency Medicine

## 2018-01-03 DIAGNOSIS — Z79899 Other long term (current) drug therapy: Secondary | ICD-10-CM | POA: Diagnosis not present

## 2018-01-03 DIAGNOSIS — R05 Cough: Secondary | ICD-10-CM | POA: Diagnosis not present

## 2018-01-03 DIAGNOSIS — J069 Acute upper respiratory infection, unspecified: Secondary | ICD-10-CM | POA: Diagnosis not present

## 2018-01-03 DIAGNOSIS — I10 Essential (primary) hypertension: Secondary | ICD-10-CM | POA: Insufficient documentation

## 2018-01-03 DIAGNOSIS — B9789 Other viral agents as the cause of diseases classified elsewhere: Secondary | ICD-10-CM | POA: Diagnosis not present

## 2018-01-03 DIAGNOSIS — Z7982 Long term (current) use of aspirin: Secondary | ICD-10-CM | POA: Diagnosis not present

## 2018-01-03 MED ORDER — BENZONATATE 100 MG PO CAPS: 200.0000 mg | ORAL_CAPSULE | Freq: Three times a day (TID) | ORAL | 0 refills | Status: DC | PRN

## 2018-01-03 NOTE — ED Notes (Signed)
From rad 

## 2018-01-03 NOTE — Discharge Instructions (Addendum)
As discussed, your chest x-ray is clear for any lung infection such as pneumonia so you do not need an antibiotic today.  I recommend continuing taking the Robitussin-DM but make sure you are drinking lots of fluids which will help this medicine help your cough being more productive.  I have also prescribed Tessalon which can help you with the frequency of your coughing.  Rest and get rechecked for any new, worsened or persistent symptoms lasting more than the next 7 days.

## 2018-01-03 NOTE — ED Triage Notes (Signed)
Pt c/o cough and chest congestion x 1 week, has taken OTC cold meds with no relief

## 2018-01-03 NOTE — ED Notes (Signed)
Pt reports a cough x 1 week   Denies other sx

## 2018-01-03 NOTE — ED Notes (Signed)
To rad 
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
## 2018-09-29 DIAGNOSIS — R69 Illness, unspecified: Secondary | ICD-10-CM | POA: Diagnosis not present

## 2018-12-01 DIAGNOSIS — B009 Herpesviral infection, unspecified: Secondary | ICD-10-CM | POA: Diagnosis not present

## 2018-12-01 DIAGNOSIS — R69 Illness, unspecified: Secondary | ICD-10-CM | POA: Diagnosis not present

## 2018-12-01 DIAGNOSIS — L84 Corns and callosities: Secondary | ICD-10-CM | POA: Diagnosis not present

## 2018-12-01 DIAGNOSIS — Z0000 Encounter for general adult medical examination without abnormal findings: Secondary | ICD-10-CM | POA: Diagnosis not present

## 2018-12-01 DIAGNOSIS — N390 Urinary tract infection, site not specified: Secondary | ICD-10-CM | POA: Diagnosis not present

## 2018-12-01 DIAGNOSIS — I10 Essential (primary) hypertension: Secondary | ICD-10-CM | POA: Diagnosis not present

## 2018-12-15 DIAGNOSIS — L84 Corns and callosities: Secondary | ICD-10-CM | POA: Diagnosis not present

## 2018-12-15 DIAGNOSIS — D649 Anemia, unspecified: Secondary | ICD-10-CM | POA: Diagnosis not present

## 2018-12-15 DIAGNOSIS — Z6829 Body mass index (BMI) 29.0-29.9, adult: Secondary | ICD-10-CM | POA: Diagnosis not present

## 2018-12-21 DIAGNOSIS — R69 Illness, unspecified: Secondary | ICD-10-CM | POA: Diagnosis not present

## 2018-12-28 DIAGNOSIS — R69 Illness, unspecified: Secondary | ICD-10-CM | POA: Diagnosis not present

## 2019-04-05 DIAGNOSIS — M159 Polyosteoarthritis, unspecified: Secondary | ICD-10-CM | POA: Diagnosis not present

## 2019-06-09 DIAGNOSIS — R202 Paresthesia of skin: Secondary | ICD-10-CM | POA: Diagnosis not present

## 2019-06-09 DIAGNOSIS — M545 Low back pain: Secondary | ICD-10-CM | POA: Diagnosis not present

## 2019-06-09 DIAGNOSIS — M159 Polyosteoarthritis, unspecified: Secondary | ICD-10-CM | POA: Diagnosis not present

## 2019-06-21 DIAGNOSIS — M545 Low back pain: Secondary | ICD-10-CM | POA: Diagnosis not present

## 2019-06-23 ENCOUNTER — Other Ambulatory Visit (HOSPITAL_COMMUNITY): Payer: Self-pay | Admitting: Family Medicine

## 2019-06-23 ENCOUNTER — Other Ambulatory Visit: Payer: Self-pay | Admitting: Family Medicine

## 2019-06-23 DIAGNOSIS — M545 Low back pain, unspecified: Secondary | ICD-10-CM

## 2019-07-21 ENCOUNTER — Ambulatory Visit (HOSPITAL_COMMUNITY)
Admission: RE | Admit: 2019-07-21 | Discharge: 2019-07-21 | Disposition: A | Discharge status: Home / Self Care | Payer: Medicare Other | Source: Ambulatory Visit | Attending: Family Medicine | Admitting: Family Medicine

## 2019-07-21 ENCOUNTER — Other Ambulatory Visit: Payer: Self-pay

## 2019-07-21 DIAGNOSIS — M545 Low back pain, unspecified: Secondary | ICD-10-CM

## 2019-07-21 DIAGNOSIS — M4804 Spinal stenosis, thoracic region: Secondary | ICD-10-CM | POA: Diagnosis not present

## 2019-07-21 DIAGNOSIS — M5127 Other intervertebral disc displacement, lumbosacral region: Secondary | ICD-10-CM | POA: Diagnosis not present

## 2019-07-21 DIAGNOSIS — M48061 Spinal stenosis, lumbar region without neurogenic claudication: Secondary | ICD-10-CM | POA: Diagnosis not present

## 2019-07-21 DIAGNOSIS — G834 Cauda equina syndrome: Secondary | ICD-10-CM | POA: Diagnosis not present

## 2019-07-27 DIAGNOSIS — M545 Low back pain: Secondary | ICD-10-CM | POA: Diagnosis not present

## 2019-08-09 DIAGNOSIS — M545 Low back pain: Secondary | ICD-10-CM | POA: Diagnosis not present

## 2019-08-09 DIAGNOSIS — M5417 Radiculopathy, lumbosacral region: Secondary | ICD-10-CM | POA: Diagnosis not present

## 2020-08-14 DIAGNOSIS — I10 Essential (primary) hypertension: Secondary | ICD-10-CM | POA: Diagnosis not present

## 2020-08-14 DIAGNOSIS — E782 Mixed hyperlipidemia: Secondary | ICD-10-CM | POA: Diagnosis not present

## 2020-08-14 DIAGNOSIS — E1169 Type 2 diabetes mellitus with other specified complication: Secondary | ICD-10-CM | POA: Diagnosis not present

## 2020-08-14 DIAGNOSIS — K439 Ventral hernia without obstruction or gangrene: Secondary | ICD-10-CM | POA: Diagnosis not present

## 2020-08-14 DIAGNOSIS — M159 Polyosteoarthritis, unspecified: Secondary | ICD-10-CM | POA: Diagnosis not present

## 2020-08-14 DIAGNOSIS — L84 Corns and callosities: Secondary | ICD-10-CM | POA: Diagnosis not present

## 2020-08-30 ENCOUNTER — Ambulatory Visit (INDEPENDENT_AMBULATORY_CARE_PROVIDER_SITE_OTHER): Payer: Medicare Other | Admitting: Podiatry

## 2020-08-30 ENCOUNTER — Encounter: Payer: Self-pay | Admitting: Podiatry

## 2020-08-30 ENCOUNTER — Other Ambulatory Visit: Payer: Self-pay

## 2020-08-30 DIAGNOSIS — M21622 Bunionette of left foot: Secondary | ICD-10-CM | POA: Diagnosis not present

## 2020-08-30 DIAGNOSIS — M778 Other enthesopathies, not elsewhere classified: Secondary | ICD-10-CM

## 2020-08-30 DIAGNOSIS — L84 Corns and callosities: Secondary | ICD-10-CM

## 2020-08-30 MED ORDER — TRIAMCINOLONE ACETONIDE 10 MG/ML IJ SUSP: 10.0000 mg | Freq: Once | INTRAMUSCULAR | Status: AC

## 2020-08-30 NOTE — Progress Notes (Signed)
Subjective:   Patient ID: Riley Dixon, male   DOB: 70 y.o.   MRN: [id number]   HPI Patient presents stating that he has a painful lesion on the outside of his left foot that is been chronic and been there a number of years has been trimmed for temporary relief but gives him chronic problems.  Does not remember specifics and does not remember injury.  Patient does not smoke would like to be more active   Review of Systems  All other systems reviewed and are negative.      Objective:  Physical Exam Vitals and nursing note reviewed.  Constitutional:      Appearance: He is well-developed.  Pulmonary:     Effort: Pulmonary effort is normal.  Musculoskeletal:        General: Normal range of motion.  Skin:    General: Skin is warm.  Neurological:     Mental Status: He is alert.    Neurovascular status found to be intact muscle strength found to be adequate range of motion within normal limits.  Patient is noted to have on the left fifth metatarsal inflammation with fluid buildup pain upon pressure to the metatarsal head itself with keratotic tissue formation and is noted to have good digital perfusion well oriented x3     Assessment:  Inflammatory capsulitis with tailor's bunion deformity and keratotic lesion with pressure against the fifth metatarsal left along with lesions of first metatarsal     Plan:  H&P reviewed all conditions discussed bony pressure and the possibility for head resection or floating osteotomy.  There is inflammation with fluid buildup I did sterile prep I injected the fifth MPJ plantar 3 mg Dexasone Kenalog 5 mg Xylocaine debrided lesion advised on anti-inflammatories and reappoint to recheck  Discussed again surgical intervention in this case educating him on what would be required from a deformity perspective

## 2020-10-19 ENCOUNTER — Ambulatory Visit (INDEPENDENT_AMBULATORY_CARE_PROVIDER_SITE_OTHER): Payer: Medicare Other | Admitting: Podiatry

## 2020-10-19 ENCOUNTER — Other Ambulatory Visit: Payer: Self-pay

## 2020-10-19 ENCOUNTER — Encounter: Payer: Self-pay | Admitting: Podiatry

## 2020-10-19 DIAGNOSIS — M21622 Bunionette of left foot: Secondary | ICD-10-CM | POA: Diagnosis not present

## 2020-10-20 ENCOUNTER — Telehealth: Payer: Self-pay | Admitting: Urology

## 2020-10-20 ENCOUNTER — Telehealth: Payer: Self-pay

## 2020-10-20 NOTE — Telephone Encounter (Signed)
DOS - 10/31/20  METATARSAL HEAD RESC 5TH LEFT --- Tensed EFFECTIVE DATE - 01/15/20   PLAN DEDUCTIBLE - $0.00 OUT OF POCKET - $3,600.00 W/ $3,575.00 REMAINING COINSURANCE - 0% COPAY - $295.00   PER [employer] WEB SITE FOR CPT CODE 15945 Notification or Prior Authorization is not required for the requested services  Decision ID #:[id number]

## 2020-10-20 NOTE — Telephone Encounter (Signed)
Riley Dixon called to reschedule his surgery with Dr. Paulla Dolly from 10/31/2020 to 11/21/2020. He stated the person that can bring him on 10/31/2020 has dialysis in the morning and can't bring him. Notified Dr. Paulla Dolly and Caren Griffins with [place]

## 2020-10-22 NOTE — Progress Notes (Signed)
Subjective:   Patient ID: Riley Dixon, male   DOB: 70 y.o.   MRN: [id number]   HPI Patient presents with a lot of discomfort on the head of the fifth metatarsal left stating that the calluses come back and it did not respond to medication or trimming and he needs something done as he cannot walk on   ROS      Objective:  Physical Exam  Neurovascular status intact with exquisite discomfort left fifth metatarsal head with keratotic tissue formation thickness and plantarflexed metatarsal     Assessment:  Chronic bone structural issue creating inflammation and pain of a chronic nature fifth metatarsal head left with failure to respond conservatively     Plan:  H&P reviewed condition and at this point I have recommended removal of the fifth metatarsal head.  I did explain the procedure risk and I do think this is the best chance he has of getting the symptoms under control.  Patient wants surgery and I allowed him to read and then signed consent form educating him on the surgery risks and alternative treatments.  Patient has had numerous conservative treatment done without treatment wants it fixed and signed consent form and I did dispense air fracture walker for complete postoperative immobilization and I want him getting used to this prior to the surgery

## 2020-10-30 ENCOUNTER — Telehealth: Payer: Self-pay

## 2020-10-30 NOTE — Telephone Encounter (Signed)
Riley Dixon called to cancel his surgery with Dr Paulla Dolly on 11/21/2020. He stated he wanted to wait till next year to have his surgery. I ask if he wanted to go ahead and reschedule and he said no. Notified Dr. Paulla Dolly and Caren Griffins with [place]

## 2020-11-06 ENCOUNTER — Encounter: Payer: Medicare Other | Admitting: Podiatry

## 2020-11-27 ENCOUNTER — Encounter: Payer: Medicare Other | Admitting: Podiatry
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
## 2022-11-16 ENCOUNTER — Other Ambulatory Visit: Payer: Self-pay

## 2022-11-16 ENCOUNTER — Encounter (HOSPITAL_COMMUNITY): Payer: Self-pay | Admitting: Emergency Medicine

## 2022-11-16 ENCOUNTER — Emergency Department (HOSPITAL_COMMUNITY)
Admission: EM | Admit: 2022-11-16 | Discharge: 2022-11-16 | Disposition: A | Discharge status: Home / Self Care | Payer: Medicare Other

## 2022-11-16 DIAGNOSIS — X58XXXA Exposure to other specified factors, initial encounter: Secondary | ICD-10-CM | POA: Diagnosis not present

## 2022-11-16 DIAGNOSIS — S161XXA Strain of muscle, fascia and tendon at neck level, initial encounter: Secondary | ICD-10-CM | POA: Diagnosis not present

## 2022-11-16 DIAGNOSIS — M542 Cervicalgia: Secondary | ICD-10-CM | POA: Diagnosis not present

## 2022-11-16 DIAGNOSIS — S39012A Strain of muscle, fascia and tendon of lower back, initial encounter: Secondary | ICD-10-CM | POA: Insufficient documentation

## 2022-11-16 DIAGNOSIS — Z7984 Long term (current) use of oral hypoglycemic drugs: Secondary | ICD-10-CM | POA: Insufficient documentation

## 2022-11-16 DIAGNOSIS — E119 Type 2 diabetes mellitus without complications: Secondary | ICD-10-CM | POA: Diagnosis not present

## 2022-11-16 DIAGNOSIS — Z79899 Other long term (current) drug therapy: Secondary | ICD-10-CM | POA: Diagnosis not present

## 2022-11-16 DIAGNOSIS — I10 Essential (primary) hypertension: Secondary | ICD-10-CM | POA: Diagnosis not present

## 2022-11-16 DIAGNOSIS — M545 Low back pain, unspecified: Secondary | ICD-10-CM | POA: Diagnosis present

## 2022-11-16 MED ORDER — METHOCARBAMOL 500 MG PO TABS: 500.0000 mg | ORAL_TABLET | Freq: Two times a day (BID) | ORAL | 0 refills | Status: DC

## 2022-11-16 MED ORDER — OXYCODONE-ACETAMINOPHEN 5-325 MG PO TABS
1.0000 | ORAL_TABLET | Freq: Once | ORAL | Status: AC
  Administered 2022-11-16: 1 via ORAL
  Filled 2022-11-16: qty 1

## 2022-11-16 MED ORDER — METHYLPREDNISOLONE 4 MG PO TBPK: ORAL_TABLET | ORAL | 0 refills | Status: DC

## 2022-11-16 NOTE — Discharge Instructions (Addendum)
Please call to schedule a follow-up appointment with [facility].  Start the steroids tomorrow.  Take the Robaxin as needed for muscle spasms/pain.  Do not drive or drink alcohol taking this is may make you drowsy.  Return to the ER for worsening symptoms.

## 2022-11-16 NOTE — ED Triage Notes (Signed)
Pt with c/o chronic back pain x 2 yrs. Pt ambulatory to room.

## 2022-11-16 NOTE — ED Provider Notes (Signed)
[place] EMERGENCY DEPARTMENT AT [facility] Provider Note   CSN: [id number] Arrival date & time: 11/16/22  2039     History  Chief Complaint  Patient presents with   Back Pain    Riley Dixon is a 72 y.o. male.  72 year old male with past medical history of hypertension, diabetes, and chronic back pain presenting to the emergency department today with pain in his neck and back.  The patient states that he has been dealing with this for the past 2 years.  He reports that he has been seeing a spine specialist at the [place].  He states that he has had steroid injections in the past.  He states he is also been on oral steroids which have helped in the past.  He states that he has not had any recent injections.  He denies any new injuries.  He states that he is having pain mostly in his lower back.  This does rated down the left leg.  He denies any focal weakness with this.  Denies any bowel or bladder dysfunction and has not had any saddle anesthesia.  He came to the emergency department today due to an exacerbation of the symptoms.  He denies any recent injuries.   Back Pain      Home Medications Prior to Admission medications   Medication Sig Start Date End Date Taking? Authorizing Provider  methocarbamol (ROBAXIN) 500 MG tablet Take 1 tablet (500 mg total) by mouth 2 (two) times daily. 11/16/22  Yes Durwin Glaze, MD  methylPREDNISolone (MEDROL DOSEPAK) 4 MG TBPK tablet Take as directed 11/16/22  Yes Durwin Glaze, MD  acetaminophen (TYLENOL) 325 MG tablet TAKE TWO TABLETS BY MOUTH THREE TIMES A DAY 11/29/14   [provider]  amLODipine (NORVASC) 10 MG tablet Take 1 tablet by mouth daily. 04/02/19   [provider]  ARIPiprazole (ABILIFY) 20 MG tablet Take 1 tablet by mouth daily. 06/02/19   [provider]  atorvastatin (LIPITOR) 80 MG tablet TAKE ONE-HALF TABLET BY MOUTH AT BEDTIME FOR CHOLESTEROL 12/07/18   [provider]  benztropine  (COGENTIN) 2 MG tablet Take 4 mg by mouth at bedtime.    [provider]  calcium carbonate (OSCAL) 1500 (600 Ca) MG TABS tablet Take 1 tablet by mouth daily. 12/21/19   [provider]  gabapentin (NEURONTIN) 100 MG capsule Take 1 capsule by mouth 3 (three) times daily. 09/14/19 09/13/20  [provider]  lisinopril (PRINIVIL,ZESTRIL) 40 MG tablet Take 40 mg by mouth daily.    [provider]  lisinopril (ZESTRIL) 40 MG tablet TAKE ONE-HALF TABLET BY MOUTH EVERY DAY FOR BLOOD PRESSURE Patient not taking: Reported on 08/30/2020 06/27/20   [provider]  metFORMIN (GLUCOPHAGE) 500 MG tablet TAKE ONE TABLET BY MOUTH TWO TIMES A DAY FOR DIABETES 04/02/19   [provider]  sildenafil (VIAGRA) 100 MG tablet TAKE ONE TABLET BY MOUTH AS DIRECTED FOR ERECTILE DYSFUNCTION ;1 HOUR BEFORE SEXUAL ACTIVITY. DO NOT TAKE WITHIN 6 HOURS OF TERAZOSIN, PRAZOSIN, ALFUZOSIN OR DOXAZOSIN. DO NOT TAKE MORE THAN 1 DOSE WITHIN 24 HOURS . DO NOT TAKE WITH NITRATES 03/08/19   [provider]  tamoxifen (NOLVADEX) 10 MG tablet Take 10 mg by mouth 2 (two) times daily. Patient not taking: Reported on 08/30/2020    [provider]  tamoxifen (NOLVADEX) 10 MG tablet Take 2 tablets by mouth daily. 05/18/19   [provider]  thiothixene (NAVANE) 5 MG capsule Take 15  mg by mouth at bedtime.    [provider]  valACYclovir (VALTREX) 500 MG tablet Take 500 mg by mouth daily.    [provider]      Allergies    Prolixin [fluphenazine]    Review of Systems   Review of Systems  Musculoskeletal:  Positive for back pain and neck pain.  All other systems reviewed and are negative.   Physical Exam Updated Vital Signs BP 105/74 (BP Location: Left Arm)   Pulse 88   Temp 97.8 F (36.6 C) (Oral)   Resp 20   Ht 5\' 8"  (1.727 m)   Wt 65.8 kg   SpO2 100%   BMI 22.05 kg/m  Physical Exam Vitals and nursing note reviewed.   Gen:  NAD Eyes: PERRL, EOMI HEENT: no oropharyngeal swelling Neck: Patient has left paraspinal tenderness over the mid and lower cervical spine Resp: clear to auscultation bilaterally Card: RRR, no murmurs, rubs, or gallops Abd: nontender, nondistended Extremities: no calf tenderness, no edema Vascular: 2+ radial pulses bilaterally, 2+ DP pulses bilaterally MSK: Positive straight leg raise on the left, the patient is tender over the left paraspinal region over his back with no step-offs or deformities, no thoracic spinal tenderness noted Neuro: Equal strength and sensation throughout the upper and lower extremities, normal patellar and Achilles reflexes bilaterally Skin: no rashes Psyc: acting appropriately   ED Results / Procedures / Treatments   Labs (all labs ordered are listed, but only abnormal results are displayed) Labs Reviewed - No data to display  EKG None  Radiology No results found.  Procedures Procedures    Medications Ordered in ED Medications  oxyCODONE-acetaminophen (PERCOCET/ROXICET) 5-325 MG per tablet 1 tablet (1 tablet Oral Given 11/16/22 2144)    ED Course/ Medical Decision Making/ A&P                                 Medical Decision Making 72 year old male with past medical history of chronic neck and back pain presenting to the emergency department today with what appears to be an exacerbation of his chronic back pain.  The patient does have a reassuring neurologic exam here.  He has been followed by a spine specialist at the [place].  He does not have any red flag symptoms for cauda equina syndrome or cord compressing lesion at this time.  He states that steroids have helped him in the past.  I will start the patient on a Medrol Dosepak.  I do not think that any emergent imaging is warranted at this time given the chronicity of the symptoms of this being more of a chronic issue.  I give the patient a short course of pain medication to take until he is able to  follow-up with his specialist at the [place].  Risk Prescription drug management.           Final Clinical Impression(s) / ED Diagnoses Final diagnoses:  Strain of lumbar region, initial encounter  Neck pain    Rx / DC Orders ED Discharge Orders          Ordered    methocarbamol (ROBAXIN) 500 MG tablet  2 times daily        11/16/22 2145    methylPREDNISolone (MEDROL DOSEPAK) 4 MG TBPK tablet        11/16/22 2145              Durwin Glaze, MD 11/16/22  2146  

## 2023-08-23 ENCOUNTER — Other Ambulatory Visit: Payer: Self-pay

## 2023-08-23 ENCOUNTER — Emergency Department (HOSPITAL_COMMUNITY)

## 2023-08-23 ENCOUNTER — Encounter (HOSPITAL_COMMUNITY): Payer: Self-pay | Admitting: Emergency Medicine

## 2023-08-23 ENCOUNTER — Emergency Department (HOSPITAL_COMMUNITY)
Admission: EM | Admit: 2023-08-23 | Discharge: 2023-08-23 | Disposition: A | Discharge status: Home / Self Care | Attending: Emergency Medicine | Admitting: Emergency Medicine

## 2023-08-23 DIAGNOSIS — R600 Localized edema: Secondary | ICD-10-CM | POA: Insufficient documentation

## 2023-08-23 DIAGNOSIS — J811 Chronic pulmonary edema: Secondary | ICD-10-CM | POA: Diagnosis not present

## 2023-08-23 DIAGNOSIS — I10 Essential (primary) hypertension: Secondary | ICD-10-CM | POA: Diagnosis not present

## 2023-08-23 DIAGNOSIS — M7989 Other specified soft tissue disorders: Secondary | ICD-10-CM | POA: Diagnosis present

## 2023-08-23 LAB — MAGNESIUM: Magnesium: 1.9 mg/dL (ref 1.7–2.4)

## 2023-08-23 LAB — CBC WITH DIFFERENTIAL/PLATELET
Abs Immature Granulocytes: 0.04 K/uL (ref 0.00–0.07)
Basophils Absolute: 0.1 K/uL (ref 0.0–0.1)
Basophils Relative: 1 %
Eosinophils Absolute: 0.2 K/uL (ref 0.0–0.5)
Eosinophils Relative: 4 %
HCT: 30.8 % — ABNORMAL LOW (ref 39.0–52.0)
Hemoglobin: 10.1 g/dL — ABNORMAL LOW (ref 13.0–17.0)
Immature Granulocytes: 1 %
Lymphocytes Relative: 32 %
Lymphs Abs: 1.8 K/uL (ref 0.7–4.0)
MCH: 32.4 pg (ref 26.0–34.0)
MCHC: 32.8 g/dL (ref 30.0–36.0)
MCV: 98.7 fL (ref 80.0–100.0)
Monocytes Absolute: 0.5 K/uL (ref 0.1–1.0)
Monocytes Relative: 8 %
Neutro Abs: 3.1 K/uL (ref 1.7–7.7)
Neutrophils Relative %: 54 %
Platelets: 259 K/uL (ref 150–400)
RBC: 3.12 MIL/uL — ABNORMAL LOW (ref 4.22–5.81)
RDW: 14.9 % (ref 11.5–15.5)
WBC: 5.7 K/uL (ref 4.0–10.5)
nRBC: 0 % (ref 0.0–0.2)

## 2023-08-23 LAB — COMPREHENSIVE METABOLIC PANEL WITH GFR
ALT: 12 U/L (ref 0–44)
AST: 17 U/L (ref 15–41)
Albumin: 3.6 g/dL (ref 3.5–5.0)
Alkaline Phosphatase: 54 U/L (ref 38–126)
Anion gap: 9 (ref 5–15)
BUN: 18 mg/dL (ref 8–23)
CO2: 22 mmol/L (ref 22–32)
Calcium: 9 mg/dL (ref 8.9–10.3)
Chloride: 107 mmol/L (ref 98–111)
Creatinine, Ser: 1.64 mg/dL — ABNORMAL HIGH (ref 0.61–1.24)
GFR, Estimated: 44 mL/min — ABNORMAL LOW (ref >60)
Glucose, Bld: 120 mg/dL — ABNORMAL HIGH (ref 70–99)
Potassium: 4.2 mmol/L (ref 3.5–5.1)
Sodium: 138 mmol/L (ref 135–145)
Total Bilirubin: 0.8 mg/dL (ref 0.0–1.2)
Total Protein: 6.6 g/dL (ref 6.5–8.1)

## 2023-08-23 LAB — URINALYSIS, ROUTINE W REFLEX MICROSCOPIC
Bilirubin Urine: NEGATIVE
Glucose, UA: NEGATIVE mg/dL
Hgb urine dipstick: NEGATIVE
Ketones, ur: NEGATIVE mg/dL
Leukocytes,Ua: NEGATIVE
Nitrite: NEGATIVE
Protein, ur: NEGATIVE mg/dL
Specific Gravity, Urine: 1.006 (ref 1.005–1.030)
pH: 6 (ref 5.0–8.0)

## 2023-08-23 LAB — BRAIN NATRIURETIC PEPTIDE: B Natriuretic Peptide: 170 pg/mL — ABNORMAL HIGH (ref 0.0–100.0)

## 2023-08-23 MED ORDER — POTASSIUM CHLORIDE CRYS ER 20 MEQ PO TBCR: 20.0000 meq | EXTENDED_RELEASE_TABLET | Freq: Every day | ORAL | 0 refills | Status: DC

## 2023-08-23 MED ORDER — FUROSEMIDE 10 MG/ML IJ SOLN
20.0000 mg | Freq: Once | INTRAMUSCULAR | Status: AC
  Administered 2023-08-23: 20 mg via INTRAVENOUS
  Filled 2023-08-23: qty 2

## 2023-08-23 MED ORDER — FUROSEMIDE 20 MG PO TABS: 20.0000 mg | ORAL_TABLET | Freq: Every day | ORAL | 0 refills | Status: AC

## 2023-08-23 NOTE — ED Triage Notes (Signed)
 Pt here for c/o generalized edema x 2 days.

## 2023-08-23 NOTE — Discharge Instructions (Addendum)
 You are seen in the emergency department for edema.  Your lab work showed your kidney function to be worse than your priors.  We are putting you on a fluid pill for 5 days along with some extra potassium.  You will need blood work rechecked with your primary care team within the week.  Please limit your salt and soda intake.  Elevate your legs when able.  Return if any worsening or concerning symptoms

## 2023-08-23 NOTE — ED Notes (Signed)
Dr Butler at bedside.  

## 2023-08-23 NOTE — ED Provider Notes (Signed)
 [place] EMERGENCY DEPARTMENT AT [facility] Provider Note   CSN: [id number] Arrival date & time: 08/23/23  1941     Patient presents with: Edema   Riley Dixon is a 73 y.o. male.  He is brought in by his family for increased swelling over the last few days, especially in his legs.  He denies any fevers or shortness of breath.  He said this has happened before but never this badly.  No change in his medications.  He eats a lot of salt food but no significant change recently.  No chest pain or shortness of breath no fevers or chills.  {Add pertinent medical, surgical, social history, OB history to YEP:67052} The history is provided by the patient.       Prior to Admission medications   Medication Sig Start Date End Date Taking? Authorizing Provider  acetaminophen  (TYLENOL ) 325 MG tablet TAKE TWO TABLETS BY MOUTH THREE TIMES A DAY 11/29/14   [provider]  amLODipine (NORVASC) 10 MG tablet Take 1 tablet by mouth daily. 04/02/19   [provider]  ARIPiprazole (ABILIFY) 20 MG tablet Take 1 tablet by mouth daily. 06/02/19   [provider]  atorvastatin (LIPITOR) 80 MG tablet TAKE ONE-HALF TABLET BY MOUTH AT BEDTIME FOR CHOLESTEROL 12/07/18   [provider]  benztropine  (COGENTIN ) 2 MG tablet Take 4 mg by mouth at bedtime.    [provider]  calcium carbonate (OSCAL) 1500 (600 Ca) MG TABS tablet Take 1 tablet by mouth daily. 12/21/19   [provider]  gabapentin (NEURONTIN) 100 MG capsule Take 1 capsule by mouth 3 (three) times daily. 09/14/19 09/13/20  [provider]  lisinopril (PRINIVIL,ZESTRIL) 40 MG tablet Take 40 mg by mouth daily.    [provider]  lisinopril (ZESTRIL) 40 MG tablet TAKE ONE-HALF TABLET BY MOUTH EVERY DAY FOR BLOOD PRESSURE Patient not taking: Reported on 08/30/2020 06/27/20   [provider]  metFORMIN (GLUCOPHAGE) 500 MG tablet TAKE ONE TABLET BY MOUTH TWO TIMES A DAY FOR  DIABETES 04/02/19   [provider]  methocarbamol  (ROBAXIN ) 500 MG tablet Take 1 tablet (500 mg total) by mouth 2 (two) times daily. 11/16/22   Ula Prentice SAUNDERS, MD  methylPREDNISolone  (MEDROL  DOSEPAK) 4 MG TBPK tablet Take as directed 11/16/22   Ula Prentice SAUNDERS, MD  sildenafil (VIAGRA) 100 MG tablet TAKE ONE TABLET BY MOUTH AS DIRECTED FOR ERECTILE DYSFUNCTION ;1 HOUR BEFORE SEXUAL ACTIVITY. DO NOT TAKE WITHIN 6 HOURS OF TERAZOSIN, PRAZOSIN, ALFUZOSIN OR DOXAZOSIN. DO NOT TAKE MORE THAN 1 DOSE WITHIN 24 HOURS . DO NOT TAKE WITH NITRATES 03/08/19   [provider]  tamoxifen (NOLVADEX) 10 MG tablet Take 10 mg by mouth 2 (two) times daily. Patient not taking: Reported on 08/30/2020    [provider]  tamoxifen (NOLVADEX) 10 MG tablet Take 2 tablets by mouth daily. 05/18/19   [provider]  thiothixene  (NAVANE ) 5 MG capsule Take 15 mg by mouth at bedtime.    [provider]  valACYclovir (VALTREX) 500 MG tablet Take 500 mg by mouth daily.    [provider]    Allergies: Prolixin [fluphenazine]    Review of Systems  Constitutional:  Negative for fever.  HENT:  Positive for facial swelling. Negative for sore throat.   Respiratory:  Negative for shortness of breath.   Cardiovascular:  Positive for leg swelling. Negative for chest pain.  Gastrointestinal:  Positive for abdominal distention. Negative for abdominal pain, nausea  and vomiting.  Genitourinary:  Negative for dysuria.  Skin:  Negative for rash.    Updated Vital Signs BP 139/75 (BP Location: Left Arm)   Pulse 77   Temp 98.9 F (37.2 C) (Oral)   Resp 20   Ht 5' 8 (1.727 m)   Wt 59 kg   SpO2 100%   BMI 19.77 kg/m   Physical Exam Vitals and nursing note reviewed.  Constitutional:      Appearance: Normal appearance. He is well-developed.  HENT:     Head: Normocephalic and atraumatic.  Eyes:     Conjunctiva/sclera: Conjunctivae normal.  Cardiovascular:     Rate and Rhythm:  Normal rate and regular rhythm.     Heart sounds: No murmur heard. Pulmonary:     Effort: Pulmonary effort is normal. No respiratory distress.     Breath sounds: Normal breath sounds.  Abdominal:     Palpations: Abdomen is soft.     Tenderness: There is no abdominal tenderness. There is no guarding or rebound.  Musculoskeletal:     Cervical back: Neck supple.     Right lower leg: Edema present.     Left lower leg: Edema present.  Skin:    General: Skin is warm and dry.  Neurological:     General: No focal deficit present.     Mental Status: He is alert.     GCS: GCS eye subscore is 4. GCS verbal subscore is 5. GCS motor subscore is 6.     (all labs ordered are listed, but only abnormal results are displayed) Labs Reviewed - No data to display  EKG: EKG Interpretation Date/Time:  Saturday August 23 2023 20:03:26 EDT Ventricular Rate:  75 PR Interval:  182 QRS Duration:  73 QT Interval:  405 QTC Calculation: 453 R Axis:   66  Text Interpretation: Sinus rhythm Left ventricular hypertrophy No old tracing to compare Confirmed by Towana Sharper [phone]) on 08/23/2023 8:22:51 PM  Radiology: No results found.  {Document cardiac monitor, telemetry assessment procedure when appropriate:32947} Procedures   Medications Ordered in the ED - No data to display    {Click here for ABCD2, HEART and other calculators REFRESH Note before signing:1}                              Medical Decision Making Amount and/or Complexity of Data Reviewed Labs: ordered. Radiology: ordered.   This patient complains of ***; this involves an extensive number of treatment Options and is a complaint that carries with it a high risk of complications and morbidity. The differential includes ***  I ordered, reviewed and interpreted labs, which included *** I ordered medication *** and reviewed PMP when indicated. I ordered imaging studies which included *** and I independently    visualized and  interpreted imaging which showed *** Additional history obtained from *** Previous records obtained and reviewed *** I consulted *** and discussed lab and imaging findings and discussed disposition.  Cardiac monitoring reviewed, *** Social determinants considered, *** Critical Interventions: ***  After the interventions stated above, I reevaluated the patient and found *** Admission and further testing considered, ***   {Document critical care time when appropriate  Document review of labs and clinical decision tools ie CHADS2VASC2, etc  Document your independent review of radiology images and any outside records  Document your discussion with family members, caretakers and with consultants  Document social determinants of health affecting pt's care  Document your decision making why or why not admission, treatments were needed:32947:::1}   Final diagnoses:  None    ED Discharge Orders     None

## 2023-08-23 NOTE — ED Notes (Signed)
..  The patient is A&OX4, ambulatory at d/c with independent steady gait, NAD. Pt verbalized understanding of d/c instructions, prescriptions and follow up care.

## 2023-12-18 ENCOUNTER — Emergency Department (HOSPITAL_COMMUNITY)

## 2023-12-18 ENCOUNTER — Telehealth (HOSPITAL_COMMUNITY): Payer: Self-pay

## 2023-12-18 ENCOUNTER — Other Ambulatory Visit: Payer: Self-pay

## 2023-12-18 ENCOUNTER — Emergency Department (HOSPITAL_COMMUNITY)
Admission: EM | Admit: 2023-12-18 | Discharge: 2023-12-18 | Disposition: A | Discharge status: Home / Self Care | Attending: Emergency Medicine | Admitting: Emergency Medicine

## 2023-12-18 ENCOUNTER — Other Ambulatory Visit (HOSPITAL_COMMUNITY): Payer: Self-pay

## 2023-12-18 ENCOUNTER — Encounter (HOSPITAL_COMMUNITY): Payer: Self-pay

## 2023-12-18 DIAGNOSIS — Z7901 Long term (current) use of anticoagulants: Secondary | ICD-10-CM | POA: Insufficient documentation

## 2023-12-18 DIAGNOSIS — I82412 Acute embolism and thrombosis of left femoral vein: Secondary | ICD-10-CM | POA: Insufficient documentation

## 2023-12-18 DIAGNOSIS — R7989 Other specified abnormal findings of blood chemistry: Secondary | ICD-10-CM | POA: Insufficient documentation

## 2023-12-18 DIAGNOSIS — I2699 Other pulmonary embolism without acute cor pulmonale: Secondary | ICD-10-CM | POA: Insufficient documentation

## 2023-12-18 DIAGNOSIS — R600 Localized edema: Secondary | ICD-10-CM | POA: Insufficient documentation

## 2023-12-18 LAB — CBC WITH DIFFERENTIAL/PLATELET
Abs Immature Granulocytes: 0.02 K/uL (ref 0.00–0.07)
Basophils Absolute: 0.1 K/uL (ref 0.0–0.1)
Basophils Relative: 1 %
Eosinophils Absolute: 0.2 K/uL (ref 0.0–0.5)
Eosinophils Relative: 3 %
HCT: 29.1 % — ABNORMAL LOW (ref 39.0–52.0)
Hemoglobin: 9.3 g/dL — ABNORMAL LOW (ref 13.0–17.0)
Immature Granulocytes: 0 %
Lymphocytes Relative: 35 %
Lymphs Abs: 2 K/uL (ref 0.7–4.0)
MCH: 32 pg (ref 26.0–34.0)
MCHC: 32 g/dL (ref 30.0–36.0)
MCV: 100 fL (ref 80.0–100.0)
Monocytes Absolute: 0.5 K/uL (ref 0.1–1.0)
Monocytes Relative: 8 %
Neutro Abs: 3.1 K/uL (ref 1.7–7.7)
Neutrophils Relative %: 53 %
Platelets: 183 K/uL (ref 150–400)
RBC: 2.91 MIL/uL — ABNORMAL LOW (ref 4.22–5.81)
RDW: 15.1 % (ref 11.5–15.5)
WBC: 5.9 K/uL (ref 4.0–10.5)
nRBC: 0 % (ref 0.0–0.2)

## 2023-12-18 LAB — COMPREHENSIVE METABOLIC PANEL WITH GFR
ALT: 7 U/L (ref 0–44)
AST: 17 U/L (ref 15–41)
Albumin: 3.8 g/dL (ref 3.5–5.0)
Alkaline Phosphatase: 45 U/L (ref 38–126)
Anion gap: 8 (ref 5–15)
BUN: 21 mg/dL (ref 8–23)
CO2: 25 mmol/L (ref 22–32)
Calcium: 8.5 mg/dL — ABNORMAL LOW (ref 8.9–10.3)
Chloride: 102 mmol/L (ref 98–111)
Creatinine, Ser: 1.75 mg/dL — ABNORMAL HIGH (ref 0.61–1.24)
GFR, Estimated: 41 mL/min — ABNORMAL LOW (ref >60)
Glucose, Bld: 83 mg/dL (ref 70–99)
Potassium: 4.6 mmol/L (ref 3.5–5.1)
Sodium: 135 mmol/L (ref 135–145)
Total Bilirubin: 0.3 mg/dL (ref 0.0–1.2)
Total Protein: 5.9 g/dL — ABNORMAL LOW (ref 6.5–8.1)

## 2023-12-18 LAB — TROPONIN T, HIGH SENSITIVITY
Troponin T High Sensitivity: 40 ng/L — ABNORMAL HIGH (ref 0–19)
Troponin T High Sensitivity: 39 ng/L — ABNORMAL HIGH (ref 0–19)

## 2023-12-18 LAB — PRO BRAIN NATRIURETIC PEPTIDE: Pro Brain Natriuretic Peptide: 346 pg/mL — ABNORMAL HIGH (ref <300.0)

## 2023-12-18 MED ORDER — HYDROCODONE-ACETAMINOPHEN 5-325 MG PO TABS: 1.0000 | ORAL_TABLET | Freq: Four times a day (QID) | ORAL | 0 refills | Status: DC | PRN

## 2023-12-18 MED ORDER — APIXABAN 5 MG PO TABS
10.0000 mg | ORAL_TABLET | Freq: Once | ORAL | Status: AC
  Administered 2023-12-18: 10 mg via ORAL
  Filled 2023-12-18: qty 2

## 2023-12-18 MED ORDER — APIXABAN (ELIQUIS) VTE STARTER PACK (10MG AND 5MG): ORAL_TABLET | ORAL | 0 refills | Status: AC

## 2023-12-18 MED ORDER — APIXABAN (ELIQUIS) EDUCATION KIT FOR DVT/PE PATIENTS: PACK | Freq: Once | Status: DC

## 2023-12-18 MED ORDER — HYDROCODONE-ACETAMINOPHEN 5-325 MG PO TABS
1.0000 | ORAL_TABLET | Freq: Once | ORAL | Status: AC
  Administered 2023-12-18: 1 via ORAL
  Filled 2023-12-18: qty 1

## 2023-12-18 MED ORDER — IOHEXOL 350 MG/ML SOLN
60.0000 mL | Freq: Once | INTRAVENOUS | Status: AC | PRN
  Administered 2023-12-18: 60 mL via INTRAVENOUS

## 2023-12-18 NOTE — Discharge Instructions (Addendum)
 Your work up today shows you have a blood clot in your left leg.  This is what is causing your pain.  We are giving you hydrocodone  to help with severe pain that you can still take Tylenol  (though have caution as the hydrocodone  has Tylenol  in it).  There is a small amount of blood clot in your lungs, we are starting you on a blood thinner called Eliquis.  Do not take NSAIDs such as ibuprofen, Advil, Naprosyn , etc. This can cause spontaneous bleeding so if you develop any bleeding or even minor injuries you need to return to the ER.  Otherwise, follow-up with the vascular surgery specialist and your primary care provider.  Return to the ER for any new or worsening symptoms, especially shortness of breath, chest pain, new or worsening leg pain, weakness or numbness or change in color of your leg, etc.  Information on my medicine - ELIQUIS (apixaban)  Why was Eliquis prescribed for you? Eliquis was prescribed to treat blood clots that may have been found in the veins of your legs (deep vein thrombosis) or in your lungs (pulmonary embolism) and to reduce the risk of them occurring again.  What do You need to know about Eliquis ? The starting dose is 10 mg (two 5 mg tablets) taken TWICE daily for the FIRST SEVEN (7) DAYS, then on (enter date)  12/25/23  the dose is reduced to ONE 5 mg tablet taken TWICE daily.  Eliquis may be taken with or without food.   Try to take the dose about the same time in the morning and in the evening. If you have difficulty swallowing the tablet whole please discuss with your pharmacist how to take the medication safely.  Take Eliquis exactly as prescribed and DO NOT stop taking Eliquis without talking to the doctor who prescribed the medication.  Stopping may increase your risk of developing a new blood clot.  Refill your prescription before you run out.  After discharge, you should have regular check-up appointments with your healthcare provider that is prescribing  your Eliquis.    What do you do if you miss a dose? If a dose of ELIQUIS is not taken at the scheduled time, take it as soon as possible on the same day and twice-daily administration should be resumed. The dose should not be doubled to make up for a missed dose.  Important Safety Information A possible side effect of Eliquis is bleeding. You should call your healthcare provider right away if you experience any of the following: Bleeding from an injury or your nose that does not stop. Unusual colored urine (red or dark brown) or unusual colored stools (red or black). Unusual bruising for unknown reasons. A serious fall or if you hit your head (even if there is no bleeding).  Some medicines may interact with Eliquis and might increase your risk of bleeding or clotting while on Eliquis. To help avoid this, consult your healthcare provider or pharmacist prior to using any new prescription or non-prescription medications, including herbals, vitamins, non-steroidal anti-inflammatory drugs (NSAIDs) and supplements.  This website has more information on Eliquis (apixaban): http://www.eliquis.com/eliquis/home

## 2023-12-18 NOTE — Telephone Encounter (Signed)
 Pharmacy Patient Advocate Encounter  Insurance verification completed.    The patient is insured through [facility]. Patient has Medicare and is not eligible for a copay card, but may be able to apply for patient assistance or Medicare RX Payment Plan (Patient Must reach out to their plan, if eligible for payment plan), if available.    Ran test claim for Eliquis  5mg  tablet and the current 30 day co-pay is $302 due to deductible, will be $47 once it is met.   This test claim was processed through [facility]- copay amounts may vary at other pharmacies due to pharmacy/plan contracts, or as the patient moves through the different stages of their insurance plan.

## 2023-12-18 NOTE — ED Triage Notes (Signed)
 Pt arrived via POV from home c/o bilateral lower extremity swelling that he noticed worsened yesterday. Pt reports he is not prescribed lasix  and does drink a lot of sodas.

## 2023-12-18 NOTE — ED Provider Notes (Addendum)
 [facility] AT [facility] Provider Note   CSN: [id number] Arrival date & time: 12/18/23  1015     Patient presents with: Leg Swelling   Riley Dixon is a 73 y.o. male.   HPI 73 year old male presents with bilateral leg swelling.  Has been present for a couple weeks.  Has also been having some pretty significant left leg pain at the same time.  Both legs seems symmetrically swollen to him.  No weakness or numbness.  No chest pain, shortness of breath.  Similar swelling a few months ago and came to the ER but did not have the pain.  In triage his blood pressure was 89/73 but since then he has had several normal blood pressures.  Prior to Admission medications   Medication Sig Start Date End Date Taking? Authorizing Provider  APIXABAN  (ELIQUIS ) VTE STARTER PACK (10MG  AND 5MG ) Take as directed on package: start with two-5mg  tablets twice daily for 7 days. On day 8, switch to one-5mg  tablet twice daily. 12/18/23  Yes Freddi Hamilton, MD  HYDROcodone -acetaminophen  (NORCO/VICODIN) 5-325 MG tablet Take 1 tablet by mouth every 6 (six) hours as needed for severe pain (pain score 7-10). 12/18/23  Yes Freddi Hamilton, MD  acetaminophen  (TYLENOL ) 325 MG tablet TAKE TWO TABLETS BY MOUTH THREE TIMES A DAY 11/29/14   [provider]  amLODipine (NORVASC) 10 MG tablet Take 1 tablet by mouth daily. 04/02/19   [provider]  ARIPiprazole (ABILIFY) 20 MG tablet Take 1 tablet by mouth daily. 06/02/19   [provider]  atorvastatin (LIPITOR) 80 MG tablet TAKE ONE-HALF TABLET BY MOUTH AT BEDTIME FOR CHOLESTEROL 12/07/18   [provider]  benztropine  (COGENTIN ) 2 MG tablet Take 4 mg by mouth at bedtime.    [provider]  calcium carbonate (OSCAL) 1500 (600 Ca) MG TABS tablet Take 1 tablet by mouth daily. 12/21/19   [provider]  furosemide  (LASIX ) 20 MG tablet Take 1 tablet (20 mg total) by mouth daily. 08/23/23   Butler,  Michael C, MD  gabapentin (NEURONTIN) 100 MG capsule Take 1 capsule by mouth 3 (three) times daily. 09/14/19 09/13/20  [provider]  lisinopril (PRINIVIL,ZESTRIL) 40 MG tablet Take 40 mg by mouth daily.    [provider]  lisinopril (ZESTRIL) 40 MG tablet TAKE ONE-HALF TABLET BY MOUTH EVERY DAY FOR BLOOD PRESSURE Patient not taking: Reported on 08/30/2020 06/27/20   [provider]  metFORMIN (GLUCOPHAGE) 500 MG tablet TAKE ONE TABLET BY MOUTH TWO TIMES A DAY FOR DIABETES 04/02/19   [provider]  methocarbamol  (ROBAXIN ) 500 MG tablet Take 1 tablet (500 mg total) by mouth 2 (two) times daily. 11/16/22   Ula Prentice SAUNDERS, MD  methylPREDNISolone  (MEDROL  DOSEPAK) 4 MG TBPK tablet Take as directed 11/16/22   Ula Prentice SAUNDERS, MD  potassium chloride  SA (KLOR-CON  M) 20 MEQ tablet Take 1 tablet (20 mEq total) by mouth daily. 08/23/23   Butler, Michael C, MD  sildenafil (VIAGRA) 100 MG tablet TAKE ONE TABLET BY MOUTH AS DIRECTED FOR ERECTILE DYSFUNCTION ;1 HOUR BEFORE SEXUAL ACTIVITY. DO NOT TAKE WITHIN 6 HOURS OF TERAZOSIN, PRAZOSIN, ALFUZOSIN OR DOXAZOSIN. DO NOT TAKE MORE THAN 1 DOSE WITHIN 24 HOURS . DO NOT TAKE WITH NITRATES 03/08/19   [provider]  tamoxifen (NOLVADEX) 10 MG tablet Take 10 mg by mouth 2 (two) times daily. Patient not taking: Reported on 08/30/2020    [provider]  tamoxifen (NOLVADEX) 10 MG tablet Take  2 tablets by mouth daily. 05/18/19   [provider]  thiothixene  (NAVANE ) 5 MG capsule Take 15 mg by mouth at bedtime.    [provider]  valACYclovir (VALTREX) 500 MG tablet Take 500 mg by mouth daily.    [provider]    Allergies: Prolixin [fluphenazine]    Review of Systems  Respiratory:  Negative for shortness of breath.   Cardiovascular:  Positive for leg swelling. Negative for chest pain.  Neurological:  Negative for weakness and numbness.    Updated Vital Signs BP 129/72   Pulse (!) 55    Temp 98.6 F (37 C) (Oral)   Resp 18   Ht 5' 8 (1.727 m)   Wt 70.3 kg   SpO2 98%   BMI 23.57 kg/m   Physical Exam Vitals and nursing note reviewed.  Constitutional:      Appearance: He is well-developed.  HENT:     Head: Normocephalic and atraumatic.  Cardiovascular:     Rate and Rhythm: Normal rate and regular rhythm.     Pulses:          Dorsalis pedis pulses are 2+ on the right side and 2+ on the left side.     Heart sounds: Normal heart sounds.  Pulmonary:     Effort: Pulmonary effort is normal.     Breath sounds: Normal breath sounds.  Abdominal:     Palpations: Abdomen is soft.     Tenderness: There is no abdominal tenderness.  Musculoskeletal:     Right lower leg: Edema present.     Left lower leg: Edema present.     Comments: Diffuse pitting edema to both feet, ankles, lower legs up to the knees.  No specific tenderness or cellulitis noted in either leg.  Skin:    General: Skin is warm and dry.  Neurological:     Mental Status: He is alert.     (all labs ordered are listed, but only abnormal results are displayed) Labs Reviewed  PRO BRAIN NATRIURETIC PEPTIDE - Abnormal; Notable for the following components:      Result Value   Pro Brain Natriuretic Peptide 346.0 (*)    All other components within normal limits  CBC WITH DIFFERENTIAL/PLATELET - Abnormal; Notable for the following components:   RBC 2.91 (*)    Hemoglobin 9.3 (*)    HCT 29.1 (*)    All other components within normal limits  COMPREHENSIVE METABOLIC PANEL WITH GFR - Abnormal; Notable for the following components:   Creatinine, Ser 1.75 (*)    Calcium 8.5 (*)    Total Protein 5.9 (*)    GFR, Estimated 41 (*)    All other components within normal limits  TROPONIN T, HIGH SENSITIVITY - Abnormal; Notable for the following components:   Troponin T High Sensitivity 40 (*)    All other components within normal limits  TROPONIN T, HIGH SENSITIVITY - Abnormal; Notable for the following components:    Troponin T High Sensitivity 39 (*)    All other components within normal limits  URINALYSIS, ROUTINE W REFLEX MICROSCOPIC    EKG: EKG Interpretation Date/Time:  Thursday December 18 2023 10:33:21 EST Ventricular Rate:  52 PR Interval:  198 QRS Duration:  84 QT Interval:  448 QTC Calculation: 416 R Axis:   51  Text Interpretation: Sinus bradycardia no acute ST/T changes HR slower, but otherwise no significant change since Aug 2025 Confirmed by Freddi Hamilton [phone]) on 12/18/2023 11:10:02 AM  Radiology: CT Angio  Chest PE W and/or Wo Contrast Result Date: 12/18/2023 CLINICAL DATA:  Bilateral lower extremity swelling which worsened yesterday. Clinical concern for pulmonary embolism. EXAM: CT ANGIOGRAPHY CHEST WITH CONTRAST TECHNIQUE: Multidetector CT imaging of the chest was performed using the standard protocol during bolus administration of intravenous contrast. Multiplanar CT image reconstructions and MIPs were obtained to evaluate the vascular anatomy. RADIATION DOSE REDUCTION: This exam was performed according to the departmental dose-optimization program which includes automated exposure control, adjustment of the mA and/or kV according to patient size and/or use of iterative reconstruction technique. CONTRAST:  60mL OMNIPAQUE  IOHEXOL  350 MG/ML SOLN COMPARISON:  Chest radiographs dated 12/18/2023 and 08/23/2023. Abdomen and pelvis CT dated 01/24/2012. FINDINGS: Cardiovascular: Small right upper lobe pulmonary arterial filling defect. Additional somewhat linear appearing, web-like right lower lobe filling defects. These are not sufficient to cause right heart strain. No other pulmonary arterial filling defects are seen. Atheromatous calcifications, including the coronary arteries and aorta. Mildly enlarged heart. No pericardial effusion. Mediastinum/Nodes: No enlarged mediastinal, hilar, or axillary lymph nodes. Thyroid gland, trachea, and esophagus demonstrate no significant findings.  Lungs/Pleura: Minimal bilateral dependent atelectasis. Mild right apical pleural and parenchymal scarring. Upper Abdomen: Partially included bilateral renal cysts, not requiring imaging follow-up. Musculoskeletal: Thoracic and lower cervical spine degenerative changes. Review of the MIP images confirms the above findings. IMPRESSION: 1. Small, acute right upper lobe pulmonary embolus. 2. Additional somewhat linear appearing, web-like right lower lobe filling defects, compatible with subacute pulmonary emboli. 3. Calcific coronary artery and aortic atherosclerosis. Critical Value/emergent results were called by telephone at the time of interpretation on 12/18/2023 at 2:32 pm to provider GLENDIA BREEDING , who verbally acknowledged these results. Aortic Atherosclerosis (ICD10-I70.0). Electronically Signed   By: Elspeth Bathe M.D.   On: 12/18/2023 14:38   US  Venous Img Lower Bilateral Result Date: 12/18/2023 CLINICAL DATA:  Bilateral lower extremity swelling with left leg pain for 1 day EXAM: Bilateral LOWER EXTREMITY VENOUS DOPPLER ULTRASOUND TECHNIQUE: Gray-scale sonography with compression, as well as color and duplex ultrasound, were performed to evaluate the deep venous system(s) from the level of the common femoral vein through the popliteal and proximal calf veins. COMPARISON:  None Available. FINDINGS: VENOUS Normal compressibility of the right common femoral, superficial femoral, and popliteal veins, as well as the visualized calf veins. Visualized portions of right profunda femoral vein and great saphenous vein unremarkable. No filling defects to suggest DVT on grayscale or color Doppler imaging. Doppler waveforms of the right lower extremity show normal direction of venous flow, normal respiratory plasticity and response to augmentation. Limited views of the contralateral common femoral vein are unremarkable. OTHER None. Limitations: Below-the-knee significant edema in the subcutaneous tissue decreases  visualization of the infrapopliteal veins. Normal compressibility of the left common femoral vein as well as the visualized calf veins. Visualized portions of the left profunda femoral vein and great saphenous vein are unremarkable. The left femoral vein and popliteal vein demonstrate dilatation and decreased color flow on Doppler imaging consistent with acute DVT which is at least occlusive in the femoral vein. IMPRESSION: Acute DVT in the left femoral vein and popliteal vein. No DVT in the right lower extremity. These results will be called to the ordering clinician or representative by the Radiologist Assistant, and communication documented in the PACS or [employer]. Electronically Signed   By: Cordella Banner   On: 12/18/2023 13:06   DG Chest Port 1 View Result Date: 12/18/2023 CLINICAL DATA:  Shortness of breath. EXAM: PORTABLE CHEST 1 VIEW  COMPARISON:  08/23/2023. FINDINGS: The heart size and mediastinal contours are within normal limits. No pulmonary edema. No focal consolidation, pleural effusion or pneumothorax. Surgical clips along the left chest and axilla. No acute osseous abnormality. IMPRESSION: No acute cardiopulmonary findings. Electronically Signed   By: Harrietta Sherry M.D.   On: 12/18/2023 11:14     Procedures   Medications Ordered in the ED  HYDROcodone -acetaminophen  (NORCO/VICODIN) 5-325 MG per tablet 1 tablet (has no administration in time range)  apixaban (ELIQUIS) Education Kit for DVT/PE patients (has no administration in time range)  apixaban (ELIQUIS) tablet 10 mg (has no administration in time range)  HYDROcodone -acetaminophen  (NORCO/VICODIN) 5-325 MG per tablet 1 tablet (1 tablet Oral Given 12/18/23 1141)  iohexol  (OMNIPAQUE ) 350 MG/ML injection 60 mL (60 mLs Intravenous Contrast Given 12/18/23 1408)                                    Medical Decision Making Amount and/or Complexity of Data Reviewed Labs: ordered.    Details: Minimally elevated troponins but  flat Radiology: ordered and independent interpretation performed.    Details: No right heart strain ECG/medicine tests: ordered and independent interpretation performed.    Details: No ischemia  Risk Prescription drug management.   Patient is found to have a DVT in his left leg.  Has a strong DP pulse and no weakness associated with it.  No color change to the skin.  No signs or symptoms of compartment syndrome.  Is having pain which was treated with some hydrocodone .  He otherwise has some minimally elevated troponins from triage orders though repeatedly denies chest pain or shortness of breath when I asked.  However given length of time of the symptoms and this result a CTA was obtained.  No large PE but does have a couple small PEs but no right heart strain.  Talk to the patient multiple times and he has repeatedly denied any chest symptoms including on exertion.  He does not want to come in to the hospital which I think is reasonable as I think this is essentially asymptomatic PE.  I think is reasonable to put him on Eliquis as he has never had any bleeding issues.  Will have him follow-up with vascular surgery and PCP as an outpatient.  His creatinine is slightly higher than when he was here a few months ago but not clinically so.   Will treat pain at home and recommend follow-up closely and give return precautions.  He did have 1 low blood pressure when he first arrived but I suspect this was a falsely low value as all of his other blood pressures have been normal.  Will discharge home with return precautions.  Of note, his PESI is 51, which indicates low risk for 30 day mortality.  Final diagnoses:  Acute deep vein thrombosis (DVT) of femoral vein of left lower extremity (HCC)  Acute pulmonary embolism without acute cor pulmonale, unspecified pulmonary embolism type [facility])    ED Discharge Orders          Ordered    HYDROcodone -acetaminophen  (NORCO/VICODIN) 5-325 MG tablet  Every 6 hours  PRN        12/18/23 1450    APIXABAN (ELIQUIS) VTE STARTER PACK (10MG  AND 5MG )       Note to Pharmacy: If starter pack unavailable, substitute with seventy-four 5 mg apixaban tabs following the above SIG directions.   12/18/23 1450  Freddi Hamilton, MD 12/18/23 1505    Freddi Hamilton, MD 12/18/23 [phone]

## 2024-02-06 ENCOUNTER — Emergency Department (HOSPITAL_COMMUNITY)
Admission: EM | Admit: 2024-02-06 | Discharge: 2024-02-12 | Disposition: A | Discharge status: Other Acute Inpatient Hospital | Attending: Emergency Medicine | Admitting: Emergency Medicine

## 2024-02-06 DIAGNOSIS — Z8659 Personal history of other mental and behavioral disorders: Secondary | ICD-10-CM | POA: Diagnosis not present

## 2024-02-06 DIAGNOSIS — F1490 Cocaine use, unspecified, uncomplicated: Secondary | ICD-10-CM | POA: Diagnosis not present

## 2024-02-06 DIAGNOSIS — F1990 Other psychoactive substance use, unspecified, uncomplicated: Secondary | ICD-10-CM

## 2024-02-06 DIAGNOSIS — Z7901 Long term (current) use of anticoagulants: Secondary | ICD-10-CM | POA: Diagnosis not present

## 2024-02-06 DIAGNOSIS — R462 Strange and inexplicable behavior: Secondary | ICD-10-CM | POA: Diagnosis present

## 2024-02-06 DIAGNOSIS — F209 Schizophrenia, unspecified: Secondary | ICD-10-CM | POA: Diagnosis not present

## 2024-02-06 DIAGNOSIS — Z638 Other specified problems related to primary support group: Secondary | ICD-10-CM | POA: Diagnosis not present

## 2024-02-06 DIAGNOSIS — Z79899 Other long term (current) drug therapy: Secondary | ICD-10-CM | POA: Diagnosis not present

## 2024-02-06 DIAGNOSIS — F1910 Other psychoactive substance abuse, uncomplicated: Secondary | ICD-10-CM | POA: Insufficient documentation

## 2024-02-06 DIAGNOSIS — F1914 Other psychoactive substance abuse with psychoactive substance-induced mood disorder: Secondary | ICD-10-CM | POA: Insufficient documentation

## 2024-02-06 DIAGNOSIS — F1994 Other psychoactive substance use, unspecified with psychoactive substance-induced mood disorder: Secondary | ICD-10-CM

## 2024-02-06 LAB — COMPREHENSIVE METABOLIC PANEL WITH GFR
ALT: 8 U/L (ref 0–44)
AST: 17 U/L (ref 15–41)
Albumin: 4.1 g/dL (ref 3.5–5.0)
Alkaline Phosphatase: 49 U/L (ref 38–126)
Anion gap: 13 (ref 5–15)
BUN: 17 mg/dL (ref 8–23)
CO2: 16 mmol/L — ABNORMAL LOW (ref 22–32)
Calcium: 8.5 mg/dL — ABNORMAL LOW (ref 8.9–10.3)
Chloride: 108 mmol/L (ref 98–111)
Creatinine, Ser: 2.16 mg/dL — ABNORMAL HIGH (ref 0.61–1.24)
GFR, Estimated: 31 mL/min — ABNORMAL LOW
Glucose, Bld: 93 mg/dL (ref 70–99)
Potassium: 4.9 mmol/L (ref 3.5–5.1)
Sodium: 137 mmol/L (ref 135–145)
Total Bilirubin: <0.2 mg/dL (ref 0.0–1.2)
Total Protein: 6.5 g/dL (ref 6.5–8.1)

## 2024-02-06 LAB — CBC WITH DIFFERENTIAL/PLATELET
Abs Immature Granulocytes: 0.02 10*3/uL (ref 0.00–0.07)
Basophils Absolute: 0.1 10*3/uL (ref 0.0–0.1)
Basophils Relative: 1 %
Eosinophils Absolute: 0.1 10*3/uL (ref 0.0–0.5)
Eosinophils Relative: 2 %
HCT: 29.7 % — ABNORMAL LOW (ref 39.0–52.0)
Hemoglobin: 9.6 g/dL — ABNORMAL LOW (ref 13.0–17.0)
Immature Granulocytes: 0 %
Lymphocytes Relative: 26 %
Lymphs Abs: 1.7 10*3/uL (ref 0.7–4.0)
MCH: 31.9 pg (ref 26.0–34.0)
MCHC: 32.3 g/dL (ref 30.0–36.0)
MCV: 98.7 fL (ref 80.0–100.0)
Monocytes Absolute: 0.5 10*3/uL (ref 0.1–1.0)
Monocytes Relative: 7 %
Neutro Abs: 4.2 10*3/uL (ref 1.7–7.7)
Neutrophils Relative %: 64 %
Platelets: 260 10*3/uL (ref 150–400)
RBC: 3.01 MIL/uL — ABNORMAL LOW (ref 4.22–5.81)
RDW: 15.7 % — ABNORMAL HIGH (ref 11.5–15.5)
WBC: 6.6 10*3/uL (ref 4.0–10.5)
nRBC: 0 % (ref 0.0–0.2)

## 2024-02-06 LAB — ETHANOL: Alcohol, Ethyl (B): <15 mg/dL

## 2024-02-06 LAB — URINE DRUG SCREEN
Amphetamines: NEGATIVE
Barbiturates: NEGATIVE
Benzodiazepines: NEGATIVE
Cocaine: POSITIVE — AB
Fentanyl: NEGATIVE
Methadone Scn, Ur: NEGATIVE
Opiates: NEGATIVE
Tetrahydrocannabinol: POSITIVE — AB

## 2024-02-06 NOTE — ED Notes (Signed)
 Patient brought in by [facility] with IVC paper work. Uploaded document.
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
## 2024-02-07 ENCOUNTER — Encounter (HOSPITAL_COMMUNITY): Payer: Self-pay

## 2024-02-07 ENCOUNTER — Other Ambulatory Visit: Payer: Self-pay

## 2024-02-07 LAB — RESP PANEL BY RT-PCR (RSV, FLU A&B, COVID)  RVPGX2
Influenza A by PCR: NEGATIVE
Influenza B by PCR: NEGATIVE
Resp Syncytial Virus by PCR: NEGATIVE
SARS Coronavirus 2 by RT PCR: NEGATIVE

## 2024-02-07 MED ORDER — HYDROXYZINE HCL 25 MG PO TABS
25.0000 mg | ORAL_TABLET | Freq: Every evening | ORAL | Status: DC | PRN
  Administered 2024-02-07 – 2024-02-10 (×3): 25 mg via ORAL
  Filled 2024-02-07 (×3): qty 1

## 2024-02-07 MED ORDER — OLANZAPINE 5 MG PO TABS
5.0000 mg | ORAL_TABLET | Freq: Every day | ORAL | Status: DC
  Administered 2024-02-07 – 2024-02-11 (×5): 5 mg via ORAL
  Filled 2024-02-07 (×5): qty 1

## 2024-02-07 MED ORDER — AMLODIPINE BESYLATE 5 MG PO TABS
10.0000 mg | ORAL_TABLET | Freq: Every day | ORAL | Status: DC
  Administered 2024-02-08 – 2024-02-12 (×5): 10 mg via ORAL
  Filled 2024-02-07 (×5): qty 2

## 2024-02-07 MED ORDER — APIXABAN 5 MG PO TABS
5.0000 mg | ORAL_TABLET | Freq: Two times a day (BID) | ORAL | Status: DC
  Administered 2024-02-07 – 2024-02-12 (×10): 5 mg via ORAL
  Filled 2024-02-07 (×10): qty 1

## 2024-02-07 MED ORDER — ATORVASTATIN CALCIUM 40 MG PO TABS
80.0000 mg | ORAL_TABLET | Freq: Every day | ORAL | Status: DC
  Administered 2024-02-08 – 2024-02-12 (×5): 80 mg via ORAL
  Filled 2024-02-07 (×5): qty 2

## 2024-02-07 MED ORDER — LISINOPRIL 10 MG PO TABS: 40.0000 mg | ORAL_TABLET | Freq: Every day | ORAL | Status: DC

## 2024-02-07 MED ORDER — ACETAMINOPHEN 325 MG PO TABS
650.0000 mg | ORAL_TABLET | Freq: Once | ORAL | Status: AC
  Administered 2024-02-07: 650 mg via ORAL
  Filled 2024-02-07: qty 2

## 2024-02-07 MED ORDER — HYDROXYZINE HCL 25 MG PO TABS
25.0000 mg | ORAL_TABLET | Freq: Once | ORAL | Status: AC
  Administered 2024-02-07: 25 mg via ORAL
  Filled 2024-02-07: qty 1

## 2024-02-07 MED ORDER — METFORMIN HCL 500 MG PO TABS
500.0000 mg | ORAL_TABLET | Freq: Two times a day (BID) | ORAL | Status: DC
  Administered 2024-02-08 – 2024-02-12 (×10): 500 mg via ORAL
  Filled 2024-02-07 (×10): qty 1

## 2024-02-07 MED ORDER — ARIPIPRAZOLE 5 MG PO TABS: 20.0000 mg | ORAL_TABLET | Freq: Every day | ORAL | Status: DC

## 2024-02-07 NOTE — Progress Notes (Signed)
 CSW spoke with the [facility] regarding bed availability. Per the Intake Specialist the facility is not currently on diversion. CSW agreed to notify the Care Team regarding the need for a [facility] referral to be sent for review.    Bunnie Gallop, MSW, LCSW-A  12:57 PM 02/07/2024

## 2024-02-07 NOTE — ED Notes (Signed)
 Pt c/o left hip pain requesting tylenol . EDP aware.

## 2024-02-07 NOTE — ED Notes (Signed)
 First Exam uploaded

## 2024-02-07 NOTE — Progress Notes (Signed)
 CSW emailed ED staff the Consent to Transfer [facility] forms to complete, along with details instructions on how to do so. CSW encouraged staff to ONLY complete * areas on the form, due to the strict rules of the [facility] hospitals. CSW is awaiting to receive the return forms.    Francisca Langenderfer, MSW, LCSW-A  1:11 PM 02/07/2024
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
## 2024-02-07 NOTE — Progress Notes (Signed)
 CSW faxed complete BH referral to [facility] for review. CSW will continue to monitor the patient to secure recommended disposition.    Keri Veale, MSW, LCSW-A  3:46 PM 02/07/2024

## 2024-02-07 NOTE — ED Notes (Signed)
Patient being evaluated by TTS °

## 2024-02-08 MED ORDER — TAMOXIFEN CITRATE 10 MG PO TABS
20.0000 mg | ORAL_TABLET | Freq: Every day | ORAL | Status: DC
  Administered 2024-02-08 – 2024-02-12 (×5): 20 mg via ORAL
  Filled 2024-02-08 (×6): qty 2

## 2024-02-08 MED ORDER — ARIPIPRAZOLE 5 MG PO TABS
30.0000 mg | ORAL_TABLET | Freq: Every day | ORAL | Status: DC
  Administered 2024-02-08 – 2024-02-12 (×5): 30 mg via ORAL
  Filled 2024-02-08 (×5): qty 6

## 2024-02-08 MED ORDER — LISINOPRIL 10 MG PO TABS
20.0000 mg | ORAL_TABLET | Freq: Every day | ORAL | Status: DC
  Administered 2024-02-08 – 2024-02-12 (×5): 20 mg via ORAL
  Filled 2024-02-08 (×5): qty 2

## 2024-02-08 MED ORDER — TAMOXIFEN CITRATE 10 MG PO TABS: 20.0000 mg | ORAL_TABLET | Freq: Every day | ORAL | Status: DC

## 2024-02-08 NOTE — Progress Notes (Signed)
 CSW followed up with the [facility] regarding the status of the [facility] referral. Per the ED staff, there is no one working in the Intake department today. CSW agreed to leave notice for the 1st CSW to follow-up.    Bunnie Gallop, MSW, LCSW-A  9:34 AM 02/08/2024

## 2024-02-08 NOTE — ED Provider Notes (Signed)
 Emergency Medicine Observation Re-evaluation Note  Riley Dixon is a 74 y.o. male, seen on rounds today.  Pt initially presented to the ED for complaints of IVC Currently, the patient is awaiting psychiatric placement.  Physical Exam  BP (!) 162/85 (BP Location: Left Arm)   Pulse (!) 47   Temp 97.8 F (36.6 C) (Oral)   Resp 16   SpO2 99%  Physical Exam Alert and in no acute distress  ED Course / MDM  EKG:EKG Interpretation Date/Time:  Saturday February 07 2024 08:49:20 EST Ventricular Rate:  53 PR Interval:  194 QRS Duration:  70 QT Interval:  434 QTC Calculation: 407 R Axis:   62  Text Interpretation: Sinus bradycardia with marked sinus arrhythmia Interpretation limited secondary to artifact Nonspecific ST abnormality Abnormal ECG When compared with ECG of 18-Dec-2023 10:33, No significant change was found Confirmed by Roselyn Dunnings [phone]) on 02/07/2024 11:35:48 PM  I have reviewed the labs performed to date as well as medications administered while in observation.  Recent changes in the last 24 hours include none.  Plan  Current plan is for psychiatric placement.    Suzette Pac, MD 02/08/24 [phone]

## 2024-02-09 NOTE — Progress Notes (Incomplete Revision)
 Inpatient Psychiatric Referral  Update: SW spoke with [facility] [place]. Per Pam, [facility] bed [facility]. SW re-faxed referral to [phone].   17:38: SW confirmed receipt of referral with Pam. Per Pam, referral will be submitted for review. SW will continue following-up for determination status.   20:01: SW follow-ed up with Shona at the [place]. Per Shona, attempts have been made to submit referral for review; however, they have not been able to successfully make contact with [facility] MD.    SW received a call from [place] at [place]. Per Manuelita Sparks VA will suspend further review of referral d/t pt's niece reaching out to [facility] this evening expressing preference for pt to transfer there instead. SW requested that review of referral be completed in the interim, as its already been initiated and pt is currently IVC'd. Per Manuelita, she will defer to AM transfer coordinator and not submit referral for review by MD tonight .   Patient was recommended inpatient per [facility]. Patient was referred to the following out of network facilities:  Destination  Service Provider Address Phone Fax  [facility]  [address], [place] [address] [phone]    Situation ongoing, CSW to continue following and update chart as more information becomes available.   Harrie Sofia MSW, [place] 02/09/2024

## 2024-02-09 NOTE — ED Notes (Addendum)
"  Pt ambulated to the bathroom   "

## 2024-02-09 NOTE — ED Provider Notes (Signed)
" °  Physical Exam  BP 120/74 (BP Location: Right Arm)   Pulse 62   Temp 98.2 F (36.8 C) (Oral)   Resp 16   SpO2 97%   Physical Exam  Procedures  Procedures  ED Course / MDM    Medical Decision Making Amount and/or Complexity of Data Reviewed Labs: ordered.  Risk OTC drugs. Prescription drug management.   Pending psychiatric placement.  Appears to be attempting placement at [facility].       Patsey Lot, MD 02/09/24 [phone]  "

## 2024-02-09 NOTE — Progress Notes (Incomplete)
 Inpatient Psychiatric Referral  Update: SW spoke with [facility] @ [place]. Per Pam, [facility] has bed availability. SW re-faxed referral to [phone].   17:38: SW confirmed receipt of referral with Pam. Per Pam, referral will be submitted for review. SW will continue following-up for determination status.   20:01: SW follow-ed up with Shona at the [place]. Per Shona, attempts have been made to submit referral for review; however, they have not been able to successfully make contact with VA MD.    21:37: SW received a call from [place] at [place]. Per Manuelita Sparks VA will suspend further review of referral citing that pt's niece reached out to [facility] this evening expressing preference for pt to transfer there instead. SW requested that review of referral be completed in the interim since referral has been initiated and pt is currently IVC'd. Per Manuelita, referral will need to be submitted to [facility] .She will defer to AM transfer coordinator and not submit referral for review tonight .   22:00: In the interim, SW contacted [facility] and spoke with [place]. Per Josefa, referral will not be reviewed until after 8 AM tomorrow. SW faxed [facility] Psychiatric Info for Patient Transfer' and ' Consent to Transfer' to ED for EDP to sign. SW to fax referral packet to [facility] when forms are returned. SW will also fax out to non-VA facilities as back-up.    Patient was recommended inpatient per [facility]. Patient was referred to the following out of network facilities:  Destination  Service Provider Address Phone Fax  [facility]  [address], [place] [address] [phone]    Situation ongoing, CSW to continue following and update chart as more information becomes available.   Harrie Sofia MSW, [place]RA[place] 02/09/2024

## 2024-02-09 NOTE — Progress Notes (Addendum)
 Inpatient Psychiatric Referral  Update: SW spoke with [facility] @ [place]. Per Pam, VA has bed availability. SW re-faxed referral to [phone].   17:38: SW confirmed receipt of referral with Pam. Per Pam, referral will be submitted for review. SW will continue following-up for determination status.   20:01: SW follow-ed up with Shona at the [place]. Per Shona, attempts have been made to submit referral for review; however, they have not been able to successfully make contact with [facility] MD.    Patient was recommended inpatient per [facility]. Patient was referred to the following out of network facilities:  Destination  Service Provider Address Phone Fax  [facility]  [address], [place] [address] [phone]    Situation ongoing, CSW to continue following and update chart as more information becomes available.   Harrie Sofia MSW, [place]RA[place] 02/09/2024

## 2024-02-10 DIAGNOSIS — F1994 Other psychoactive substance use, unspecified with psychoactive substance-induced mood disorder: Secondary | ICD-10-CM

## 2024-02-10 NOTE — ED Notes (Signed)
 Pt is taking a shower.
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
## 2024-02-10 NOTE — ED Provider Notes (Signed)
 Emergency Medicine Observation Re-evaluation Note  Riley Dixon is a 74 y.o. male, seen on rounds today.  Pt initially presented to the ED for complaints of IVC Currently, the patient is asleep.  Physical Exam  BP 118/66   Pulse 71   Temp 98.5 F (36.9 C) (Oral)   Resp 14   SpO2 100%  Physical Exam General: asleep Cardiac: asleep Lungs: asleep Psych: asleep  ED Course / MDM  EKG:EKG Interpretation Date/Time:  Saturday February 07 2024 08:49:20 EST Ventricular Rate:  53 PR Interval:  194 QRS Duration:  70 QT Interval:  434 QTC Calculation: 407 R Axis:   62  Text Interpretation: Sinus bradycardia with marked sinus arrhythmia Interpretation limited secondary to artifact Nonspecific ST abnormality Abnormal ECG When compared with ECG of 18-Dec-2023 10:33, No significant change was found Confirmed by Roselyn Dunnings [phone]) on 02/07/2024 11:35:48 PM  I have reviewed the labs performed to date as well as medications administered while in observation.  No recent changes in the last 24 hours.  Plan  Current plan is for inpatient psychiatric admission.    Riley Hamilton, MD 02/10/24 [phone]

## 2024-02-11 NOTE — ED Provider Notes (Signed)
 Emergency Medicine Observation Re-evaluation Note  Riley Dixon is a 74 y.o. male, seen on rounds today.  Pt initially presented to the ED for complaints of IVC Currently, the patient is asleep.  Pt presents for reported erratic behavior and threats of self-harm involving weapons. He carries the psychiatric diagnoses of schizoaffective disorder and has a past medical history of  dementia and is a crack cocaine user.  Psychiatry team recommends admission for stabilization.  Patient is involuntarily committed  Physical Exam  BP 125/74 (BP Location: Right Arm)   Pulse 84   Temp 98.1 F (36.7 C)   Resp 18   SpO2 100%  Physical Exam General: No acute distress  ED Course / MDM  EKG:EKG Interpretation Date/Time:  Saturday February 07 2024 08:49:20 EST Ventricular Rate:  53 PR Interval:  194 QRS Duration:  70 QT Interval:  434 QTC Calculation: 407 R Axis:   62  Text Interpretation: Sinus bradycardia with marked sinus arrhythmia Interpretation limited secondary to artifact Nonspecific ST abnormality Abnormal ECG When compared with ECG of 18-Dec-2023 10:33, No significant change was found Confirmed by Roselyn Dunnings [phone]) on 02/07/2024 11:35:48 PM  I have reviewed the labs performed to date as well as medications administered while in observation.  Recent changes in the last 24 hours include -no new changes.  Plan  Current plan is for holding patient for stabilization.    Charlyn Sora, MD 02/11/24 [phone]

## 2024-02-11 NOTE — Progress Notes (Signed)
 Inpatient Psychiatric Referral  Patient was recommended inpatient per Jadeka Mangrum (NP) . There are no available beds at [facility], per [facility]. Patient was referred to the following out of network facilities:  Destination  Service Provider Address Phone Fax  [facility] Center-Adult  [address] [place], [place] [address] [phone]  [facility] Center-Geriatric  [address] [place], [place] [address] [phone]  [facility]  [address], [place] [address] [phone]  [facility]  [address], [place] [address] [phone]  [facility] EFAX  [address] [place], [place] [place] [phone]  [facility]  [address] [place] [address] [phone]  [facility]  [address] [place] [address] [phone]  [facility]  [address] [place] [address] [phone]  [facility]  [address] [place] [address] [phone]  [facility]  [address] [place] [address] [phone]  [facility]  [address], [place] [address] [phone]  [facility]  [address] [place] [address] [phone]  [facility]  [address] [place] [address] [phone]  [facility]  [place] [phone]  [facility]  [address], [place] [address] [phone]  [facility]  [place] [phone]    Situation ongoing, CSW to continue following and update chart as more information becomes available.   Harrie Sofia MSW,  [place] 02/11/2024

## 2024-02-11 NOTE — Progress Notes (Signed)
 LCSW Progress Note  [id number]   Tony Granquist  02/11/2024  9:12 AM  Description:   Inpatient Psychiatric Referral  Patient was recommended inpatient per  Cathaleen Jacobson (NP). There are no available beds at [facility], per [facility] RN). Patient was referred to the following out of network facilities:   Destination  Service Provider Address Phone Fax  [facility] Center-Adult  [address] [place], [place] [address] [phone]  [facility] Center-Geriatric  [address] [place], [place] [address] [phone]  [facility]  [address] [place] [address] [phone]  [facility]  [address] [place] [address] [phone]  [facility] EFAX  [address] [place], [place] [phone]  [facility]  [address] [place], [place] [address] [phone]  [facility]  [address], [place] [address] [phone]  [facility]  [place] [phone]      Situation ongoing, CSW to continue following and update chart as more information becomes available.     Tunisia Kammi Hechler, MSW, LCSW  02/11/2024 9:12 AM

## 2024-02-12 LAB — BASIC METABOLIC PANEL WITH GFR
Anion gap: 12 (ref 5–15)
BUN: 23 mg/dL (ref 8–23)
CO2: 25 mmol/L (ref 22–32)
Calcium: 9.2 mg/dL (ref 8.9–10.3)
Chloride: 105 mmol/L (ref 98–111)
Creatinine, Ser: 1.51 mg/dL — ABNORMAL HIGH (ref 0.61–1.24)
GFR, Estimated: 48 mL/min — ABNORMAL LOW
Glucose, Bld: 106 mg/dL — ABNORMAL HIGH (ref 70–99)
Potassium: 4.7 mmol/L (ref 3.5–5.1)
Sodium: 142 mmol/L (ref 135–145)

## 2024-02-12 LAB — SARS CORONAVIRUS 2 BY RT PCR: SARS Coronavirus 2 by RT PCR: NEGATIVE

## 2024-02-12 MED ORDER — ACETAMINOPHEN 500 MG PO TABS
1000.0000 mg | ORAL_TABLET | Freq: Once | ORAL | Status: AC
  Administered 2024-02-12: 1000 mg via ORAL
  Filled 2024-02-12: qty 2

## 2024-02-12 NOTE — ED Notes (Signed)
 See triage notes. Pt awaiting BH bed. Pt aware. Pt resting with sitter at bedside. Nad. Denies any needs at this time.

## 2024-02-12 NOTE — ED Provider Notes (Signed)
 Patient will be trasnferred to the [place]. Accepted by Dr. Spencer Fusi, Lurena Naeve L, DO 02/12/24 1732

## 2024-02-12 NOTE — BH Assessment (Addendum)
 LCSW Progress Note   [id number]    Riley Dixon   02/12/2024   5:23 PM   Description:    Inpatient Psychiatric Referral  Disposition Counselor followed up with the [facility] regarding the patients acceptance. The patient has been confirmed for admission at the [facility]. The point of contact is Garrel at [phone]. The patient is scheduled for admission today and must arrive before 9:00 PM. If the patient does not arrive by this time, the disposition counselor was advised that the referral will need to be resubmitted tomorrow for bed placement.  The accepting provider is Dr. Georgette Bryant. Nurse report can be coordinated at [phone]. According to the point of contact, transportation should be arranged prior to initiating the nurse report at the [facility]. Upon arrival, the patient must present to the Emergency Department at the [facility], located at [address] [place], [place] [address].

## 2024-02-12 NOTE — ED Provider Notes (Signed)
 Emergency Medicine Observation Re-evaluation Note  Riley Dixon is a 74 y.o. male, seen on rounds today.  Pt initially presented to the ED for complaints of erratic behavior in the context of known schizophrenia and substance abuse IVC Currently, the patient is resting.  Physical Exam  BP 135/84 (BP Location: Left Arm)   Pulse 76   Temp 98 F (36.7 C) (Oral)   Resp 16   SpO2 100%  Physical Exam General: Adult male no distress Cardiac: Regular rate and rhythm Lungs: No increased work of breathing Psych: Calm  ED Course / MDM  EKG:EKG Interpretation Date/Time:  Saturday February 07 2024 08:49:20 EST Ventricular Rate:  53 PR Interval:  194 QRS Duration:  70 QT Interval:  434 QTC Calculation: 407 R Axis:   62  Text Interpretation: Sinus bradycardia with marked sinus arrhythmia Interpretation limited secondary to artifact Nonspecific ST abnormality Abnormal ECG When compared with ECG of 18-Dec-2023 10:33, No significant change was found Confirmed by Roselyn Dunnings [phone]) on 02/07/2024 11:35:48 PM  I have reviewed the labs performed to date as well as medications administered while in observation.  Recent changes in the last 24 hours include additional assessment from behavioral health, continued recommendation for inpatient care.  IVC renewed.  Plan  Current plan is for placement.    Garrick Charleston, MD 02/12/24 [phone]

## 2024-02-12 NOTE — ED Notes (Signed)
 [facility] came to transport patient without having the served paperwork.   Waiting on [employer] to return with paper work so patient can be transported to facility. [facility] made aware patient has to arrive by 9:30pm.   Nurse made aware
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
## 2024-02-12 NOTE — ED Notes (Signed)
 Pt ate meal, drank 2 waters and coffee

## 2024-02-12 NOTE — ED Notes (Signed)
 [facility] before the [facility] could be [facility]. Nurse and MD made aware

## 2024-02-12 NOTE — ED Notes (Signed)
 The patient is ambulatory to and from restroom with independent steady gait.

## 2024-02-12 NOTE — ED Provider Notes (Signed)
 [facility] COVID test, repeat chemistry panel for consideration of acceptance.  Patient awake and, after initial evaluation, pleasantly interactive.   Riley Charleston, MD 02/12/24 1349
